# Patient Record
Sex: Female | Born: 1970 | Race: White | Hispanic: No | Smoking: Never smoker
Health system: Southern US, Community
[De-identification: ages and names within clinical notes are randomized; demographics above are authoritative.]

## PROBLEM LIST (undated history)

## (undated) DIAGNOSIS — R569 Unspecified convulsions: Secondary | ICD-10-CM

## (undated) DIAGNOSIS — F4310 Post-traumatic stress disorder, unspecified: Secondary | ICD-10-CM

## (undated) DIAGNOSIS — F329 Major depressive disorder, single episode, unspecified: Secondary | ICD-10-CM

## (undated) DIAGNOSIS — F32A Depression, unspecified: Secondary | ICD-10-CM

## (undated) HISTORY — DX: Major depressive disorder, single episode, unspecified: F32.9

## (undated) HISTORY — DX: Depression, unspecified: F32.A

## (undated) HISTORY — PX: TUBAL LIGATION: SHX77

---

## 2017-07-18 ENCOUNTER — Other Ambulatory Visit: Payer: Self-pay

## 2017-07-18 ENCOUNTER — Emergency Department (HOSPITAL_COMMUNITY)
Admission: EM | Admit: 2017-07-18 | Discharge: 2017-07-18 | Disposition: A | Discharge status: Home / Self Care | Payer: Medicare Other | Attending: Emergency Medicine | Admitting: Emergency Medicine

## 2017-07-18 ENCOUNTER — Encounter (HOSPITAL_COMMUNITY): Payer: Self-pay

## 2017-07-18 DIAGNOSIS — R569 Unspecified convulsions: Secondary | ICD-10-CM | POA: Diagnosis present

## 2017-07-18 DIAGNOSIS — R079 Chest pain, unspecified: Secondary | ICD-10-CM | POA: Insufficient documentation

## 2017-07-18 DIAGNOSIS — R5383 Other fatigue: Secondary | ICD-10-CM | POA: Insufficient documentation

## 2017-07-18 DIAGNOSIS — R531 Weakness: Secondary | ICD-10-CM

## 2017-07-18 DIAGNOSIS — R51 Headache: Secondary | ICD-10-CM | POA: Insufficient documentation

## 2017-07-18 HISTORY — DX: Post-traumatic stress disorder, unspecified: F43.10

## 2017-07-18 HISTORY — DX: Unspecified convulsions: R56.9

## 2017-07-18 LAB — BASIC METABOLIC PANEL
Anion gap: 9 (ref 5–15)
BUN: 7 mg/dL (ref 6–20)
CHLORIDE: 105 mmol/L (ref 98–111)
CO2: 27 mmol/L (ref 22–32)
Calcium: 8.9 mg/dL (ref 8.9–10.3)
Creatinine, Ser: 0.76 mg/dL (ref 0.44–1.00)
GFR calc Af Amer: >60 mL/min (ref >60)
GFR calc non Af Amer: >60 mL/min (ref >60)
GLUCOSE: 97 mg/dL (ref 70–99)
Potassium: 4 mmol/L (ref 3.5–5.1)
SODIUM: 141 mmol/L (ref 135–145)

## 2017-07-18 LAB — CBC
HEMATOCRIT: 37.7 % (ref 36.0–46.0)
Hemoglobin: 12.6 g/dL (ref 12.0–15.0)
MCH: 29.2 pg (ref 26.0–34.0)
MCHC: 33.4 g/dL (ref 30.0–36.0)
MCV: 87.5 fL (ref 78.0–100.0)
Platelets: 203 10*3/uL (ref 150–400)
RBC: 4.31 MIL/uL (ref 3.87–5.11)
RDW: 13.5 % (ref 11.5–15.5)
WBC: 8.3 10*3/uL (ref 4.0–10.5)

## 2017-07-18 LAB — PHENYTOIN LEVEL, TOTAL: Phenytoin Lvl: <2.5 ug/mL — ABNORMAL LOW (ref 10.0–20.0)

## 2017-07-18 LAB — URINALYSIS, ROUTINE W REFLEX MICROSCOPIC
BILIRUBIN URINE: NEGATIVE
Glucose, UA: NEGATIVE mg/dL
KETONES UR: NEGATIVE mg/dL
Nitrite: NEGATIVE
PROTEIN: NEGATIVE mg/dL
Specific Gravity, Urine: 1.02 (ref 1.005–1.030)
pH: 5 (ref 5.0–8.0)

## 2017-07-18 LAB — I-STAT BETA HCG BLOOD, ED (MC, WL, AP ONLY): I-stat hCG, quantitative: <5 m[IU]/mL (ref <5)

## 2017-07-18 LAB — CBG MONITORING, ED: GLUCOSE-CAPILLARY: 99 mg/dL (ref 70–99)

## 2017-07-18 MED ORDER — PHENYTOIN SODIUM EXTENDED 100 MG PO CAPS: 100.0000 mg | ORAL_CAPSULE | Freq: Two times a day (BID) | ORAL | 0 refills | Status: DC

## 2017-07-18 MED ORDER — PHENYTOIN SODIUM 50 MG/ML IJ SOLN
1000.0000 mg | Freq: Once | INTRAMUSCULAR | Status: AC
  Administered 2017-07-18: 1000 mg via INTRAVENOUS
  Filled 2017-07-18: qty 20

## 2017-07-18 NOTE — Discharge Instructions (Signed)
As discussed, your evaluation today has been largely reassuring.  But, it is important that you monitor your condition carefully, and do not hesitate to return to the ED if you develop new, or concerning changes in your condition. ? ?Otherwise, please follow-up with your physician for appropriate ongoing care. ? ?

## 2017-07-18 NOTE — ED Provider Notes (Signed)
[facility]-EMERGENCY DEPT Provider Note   CSN: [id number] Arrival date & time: 07/18/17  1356     History   Chief Complaint Chief Complaint  Patient presents with  . Fatigue  . Seizures    HPI Teresa Cervantes is a 47 y.o. female.  HPI Patient presents with her husband who assists with the HPI. Patient has multiple medical issues including seizures.  When she typically takes Dilantin but ran out of his medication about 1 week ago. They note that over the past week patient has had increasing seizure activity, though none in the past few hours. She also has generalized weakness, with occasional episodes of pronounced weakness in her legs, though not sustained, and without pain. She also has episodes of chest pain, headache. Patient recently relocated from [place] to this area, has no physician. They deny other notable changes.   Past Medical History:  Diagnosis Date  . PTSD (post-traumatic stress disorder)   . Seizures (HCC)     There are no active problems to display for this patient.   Past Surgical History:  Procedure Laterality Date  . CESAREAN SECTION     X2     OB History   None      Home Medications    Prior to Admission medications   Not on File    Family History History reviewed. No pertinent family history.  Social History Social History   Tobacco Use  . Smoking status: Never Smoker  . Smokeless tobacco: Never Used  Substance Use Topics  . Alcohol use: Not Currently  . Drug use: Never     Allergies   Penicillins   Review of Systems Review of Systems  Constitutional:       Per HPI, otherwise negative  HENT:       Per HPI, otherwise negative  Respiratory:       Per HPI, otherwise negative  Cardiovascular:       Per HPI, otherwise negative  Gastrointestinal: Negative for vomiting.  Endocrine:       Negative aside from HPI  Genitourinary:       Neg aside from HPI   Musculoskeletal:       Per HPI,  otherwise negative  Skin: Negative.   Neurological: Positive for seizures, weakness and light-headedness. Negative for syncope.  Psychiatric/Behavioral: Positive for decreased concentration.     Physical Exam Updated Vital Signs BP 114/71   Pulse (!) 54   Temp 98.2 F (36.8 C) (Oral)   Resp 12   Ht 5\' 4"  (1.626 m)   Wt 118.8 kg (262 lb)   LMP 07/13/2017 (Approximate)   SpO2 100%   BMI 44.97 kg/m   Physical Exam  Constitutional: She is oriented to person, place, and time. She appears well-developed and well-nourished. No distress.  HENT:  Head: Normocephalic and atraumatic.  Eyes: Conjunctivae and EOM are normal.  Cardiovascular: Normal rate and regular rhythm.  Pulmonary/Chest: Effort normal and breath sounds normal. No stridor. No respiratory distress.  Abdominal: She exhibits no distension.  Musculoskeletal: She exhibits no edema.  Neurological: She is alert and oriented to person, place, and time. She displays no atrophy and no tremor. No cranial nerve deficit. She exhibits normal muscle tone. She displays no seizure activity. Coordination normal.  Skin: Skin is warm and dry.  Psychiatric: She has a normal mood and affect.  Nursing note and vitals reviewed.    ED Treatments / Results  Labs (all labs ordered are listed, but only abnormal  results are displayed) Labs Reviewed  URINALYSIS, ROUTINE W REFLEX MICROSCOPIC - Abnormal; Notable for the following components:      Result Value   Color, Urine AMBER (*)    APPearance CLOUDY (*)    Hgb urine dipstick MODERATE (*)    Leukocytes, UA SMALL (*)    Bacteria, UA FEW (*)    Squamous Epithelial / LPF >50 (*)    All other components within normal limits  PHENYTOIN LEVEL, TOTAL - Abnormal; Notable for the following components:   Phenytoin Lvl <2.5 (*)    All other components within normal limits  BASIC METABOLIC PANEL  CBC  CBG MONITORING, ED  I-STAT BETA HCG BLOOD, ED ([facility], [facility], [facility] ONLY)     EKG None  Radiology No results found.  Procedures Procedures (including critical care time)  Medications Ordered in ED Medications  phenytoin (DILANTIN) 1,000 mg in sodium chloride 0.9 % 250 mL IVPB (1,000 mg Intravenous New Bag/Given 07/18/17 1921)     Initial Impression / Assessment and Plan / ED Course  I have reviewed the triage vital signs and the nursing notes.  Pertinent labs & imaging results that were available during my care of the patient were reviewed by me and considered in my medical decision making (see chart for details).     7:37 PM Patient awake and alert, interacting in a typical manner, hemodynamically unremarkable. We discussed all findings, including subtherapeutic phenytoin level. Patient has no seizure activity while in the emergency department, and given her reassuring findings, patient will start Dilantin load.  Update:, Patient tolerated Dilantin appropriately, without additional seizure activity, and with reassuring findings, patient discharged in stable condition.  Final Clinical Impressions(s) / ED Diagnoses  Weakness   Gerhard MunchLockwood, Keela Rubert, MD 07/18/17 [phone]

## 2017-07-18 NOTE — ED Notes (Signed)
pts vein blew during blood draw unable to successfully get remaining labs.

## 2017-07-18 NOTE — ED Triage Notes (Addendum)
PT C/O FEELING FATIGUE, CHEST PAINS ON AND OFF, LEFT-SIDED HEADACHE RADIATING DOWN THE LEFT ARM, WITNESSED SEIZURES X2, AND INCREASED THIRST X1 WEEK. PT STS SHE TAKES DILANTIN FOR SEIZURES, AND THE LAST TIME SHE FELT THIS WAY, HER LEVELS WERE VERY LOW. DENIES HEADACHE OR CHEST PAIN AT THIS TIME. PT STS SHE JUST MOVED FROM [place] NO HEALTHCARE SET UP HERE.

## 2017-08-09 ENCOUNTER — Encounter: Payer: Self-pay | Admitting: General Practice

## 2017-08-09 ENCOUNTER — Ambulatory Visit: Payer: Self-pay

## 2017-09-03 ENCOUNTER — Encounter (HOSPITAL_COMMUNITY): Payer: Self-pay | Admitting: *Deleted

## 2017-09-03 ENCOUNTER — Emergency Department (HOSPITAL_COMMUNITY)
Admission: EM | Admit: 2017-09-03 | Discharge: 2017-09-03 | Disposition: A | Discharge status: Home / Self Care | Payer: Medicare Other | Attending: Emergency Medicine | Admitting: Emergency Medicine

## 2017-09-03 DIAGNOSIS — Z79899 Other long term (current) drug therapy: Secondary | ICD-10-CM | POA: Insufficient documentation

## 2017-09-03 DIAGNOSIS — R569 Unspecified convulsions: Secondary | ICD-10-CM

## 2017-09-03 LAB — BASIC METABOLIC PANEL
Anion gap: 10 (ref 5–15)
BUN: 12 mg/dL (ref 6–20)
CHLORIDE: 107 mmol/L (ref 98–111)
CO2: 21 mmol/L — ABNORMAL LOW (ref 22–32)
CREATININE: 0.83 mg/dL (ref 0.44–1.00)
Calcium: 8.1 mg/dL — ABNORMAL LOW (ref 8.9–10.3)
GFR calc non Af Amer: >60 mL/min (ref >60)
Glucose, Bld: 115 mg/dL — ABNORMAL HIGH (ref 70–99)
Potassium: 4 mmol/L (ref 3.5–5.1)
SODIUM: 138 mmol/L (ref 135–145)

## 2017-09-03 LAB — CBC WITH DIFFERENTIAL/PLATELET
Basophils Absolute: 0 10*3/uL (ref 0.0–0.1)
Basophils Relative: 0 %
Eosinophils Absolute: 0.1 10*3/uL (ref 0.0–0.7)
Eosinophils Relative: 1 %
HEMATOCRIT: 34.8 % — AB (ref 36.0–46.0)
HEMOGLOBIN: 11.9 g/dL — AB (ref 12.0–15.0)
Lymphocytes Relative: 37 %
Lymphs Abs: 2.7 10*3/uL (ref 0.7–4.0)
MCH: 29.8 pg (ref 26.0–34.0)
MCHC: 34.2 g/dL (ref 30.0–36.0)
MCV: 87.2 fL (ref 78.0–100.0)
Monocytes Absolute: 0.6 10*3/uL (ref 0.1–1.0)
Monocytes Relative: 9 %
NEUTROS ABS: 3.8 10*3/uL (ref 1.7–7.7)
Neutrophils Relative %: 53 %
Platelets: 199 10*3/uL (ref 150–400)
RBC: 3.99 MIL/uL (ref 3.87–5.11)
RDW: 13.6 % (ref 11.5–15.5)
WBC: 7.3 10*3/uL (ref 4.0–10.5)

## 2017-09-03 LAB — PHENYTOIN LEVEL, TOTAL: Phenytoin Lvl: <2.5 ug/mL — ABNORMAL LOW (ref 10.0–20.0)

## 2017-09-03 MED ORDER — PHENYTOIN SODIUM EXTENDED 100 MG PO CAPS: 100.0000 mg | ORAL_CAPSULE | Freq: Two times a day (BID) | ORAL | 2 refills | Status: DC

## 2017-09-03 MED ORDER — PHENYTOIN SODIUM EXTENDED 100 MG PO CAPS
300.0000 mg | ORAL_CAPSULE | Freq: Once | ORAL | Status: AC
  Administered 2017-09-03: 300 mg via ORAL
  Filled 2017-09-03: qty 3

## 2017-09-03 NOTE — ED Provider Notes (Signed)
[facility]-EMERGENCY DEPT Provider Note   CSN: [id number] Arrival date & time: 09/03/17  1658     History   Chief Complaint Chief Complaint  Patient presents with  . Seizures  . Headache    HPI Teresa Cervantes is a 47 y.o. female.  The history is provided by the patient. No language interpreter was used.  Seizures   This is a recurrent problem. The current episode started more than 1 week ago. The problem has not changed since onset.There were 4 to 5 seizures. Associated symptoms include headaches. Characteristics include rhythmic jerking. The episode was witnessed. The seizures did not continue in the ED. The seizure(s) had no focality. There has been no fever.  Headache    Pt complains of being out of her dilantin for a week. She reports she has been having seizures.  Pt is currently feeling fine.  Pt is here to get back on her dilantin  Past Medical History:  Diagnosis Date  . PTSD (post-traumatic stress disorder)   . Seizures (HCC)     There are no active problems to display for this patient.   Past Surgical History:  Procedure Laterality Date  . CESAREAN SECTION     X2     OB History   None      Home Medications    Prior to Admission medications   Medication Sig Start Date End Date Taking? Authorizing Provider  FLUoxetine (PROZAC) 20 MG capsule Take 20 mg by mouth daily.   Yes [provider]  phenytoin (DILANTIN) 100 MG ER capsule Take 1 capsule (100 mg total) by mouth 2 (two) times daily. 07/18/17   Gerhard Munch, MD    Family History No family history on file.  Social History Social History   Tobacco Use  . Smoking status: Never Smoker  . Smokeless tobacco: Never Used  Substance Use Topics  . Alcohol use: Not Currently  . Drug use: Never     Allergies   Penicillins   Review of Systems Review of Systems  Neurological: Positive for seizures and headaches.  All other systems reviewed and are  negative.    Physical Exam Updated Vital Signs BP 137/85 (BP Location: Left Arm)   Pulse 66   Temp 98.1 F (36.7 C) (Oral)   Resp 18   LMP 08/31/2017   SpO2 94%   Physical Exam  Constitutional: She is oriented to person, place, and time. She appears well-developed and well-nourished.  HENT:  Head: Normocephalic.  Eyes: Pupils are equal, round, and reactive to light. EOM are normal.  Neck: Normal range of motion.  Pulmonary/Chest: Effort normal.  Abdominal: She exhibits no distension.  Musculoskeletal: Normal range of motion.  Neurological: She is alert and oriented to person, place, and time. She has normal strength.  Skin: Skin is warm.  Psychiatric: She has a normal mood and affect.  Nursing note and vitals reviewed.    ED Treatments / Results  Labs (all labs ordered are listed, but only abnormal results are displayed) Labs Reviewed  CBC WITH DIFFERENTIAL/PLATELET - Abnormal; Notable for the following components:      Result Value   Hemoglobin 11.9 (*)    HCT 34.8 (*)    All other components within normal limits  BASIC METABOLIC PANEL - Abnormal; Notable for the following components:   CO2 21 (*)    Glucose, Bld 115 (*)    Calcium 8.1 (*)    All other components within normal limits  PHENYTOIN  LEVEL, TOTAL - Abnormal; Notable for the following components:   Phenytoin Lvl <2.5 (*)    All other components within normal limits    EKG None  Radiology No results found.  Procedures Procedures (including critical care time)  Medications Ordered in ED Medications - No data to display   Initial Impression / Assessment and Plan / ED Course  I have reviewed the triage vital signs and the nursing notes.  Pertinent labs & imaging results that were available during my care of the patient were reviewed by me and considered in my medical decision making (see chart for details).     MD  Dilantin level less than 2.5.   I will restart pt on dilantin.   Pt given  300mg  here.  Rx for 100mg  bid as previously prescribed.   Final Clinical Impressions(s) / ED Diagnoses   Final diagnoses:  Seizure [facility](HCC)    ED Discharge Orders         Ordered    phenytoin (DILANTIN) 100 MG ER capsule  2 times daily     09/03/17 1900        An After Visit Summary was printed and given to the patient.    Elson AreasSofia, Sydny Schnitzler K, PA-C 09/03/17 1918    Virgina Norfolkuratolo, Adam, DO 09/04/17 0004

## 2017-09-03 NOTE — ED Triage Notes (Signed)
Pt states she has been having more seizures lately. Pt also complains of headache today. Pt has been out of dilantin x 1 week. Pt also complains of burning while urinating. Pt has been unable to see a neurologist to get back on dilantin.

## 2017-09-03 NOTE — Discharge Instructions (Signed)
See your Primary care provider for evaluation and management of seizure medications

## 2017-09-06 ENCOUNTER — Emergency Department (HOSPITAL_COMMUNITY)
Admission: EM | Admit: 2017-09-06 | Discharge: 2017-09-07 | Disposition: A | Discharge status: Home / Self Care | Payer: Medicare Other | Attending: Emergency Medicine | Admitting: Emergency Medicine

## 2017-09-06 ENCOUNTER — Other Ambulatory Visit: Payer: Self-pay

## 2017-09-06 DIAGNOSIS — R0789 Other chest pain: Secondary | ICD-10-CM | POA: Diagnosis present

## 2017-09-06 DIAGNOSIS — Z79899 Other long term (current) drug therapy: Secondary | ICD-10-CM | POA: Insufficient documentation

## 2017-09-06 NOTE — ED Triage Notes (Signed)
Pt was seen here Friday for seizures.  Went to bed tonight had sudden onset of chest pain below both breasts. Hands felt numb/tingling. 12 lead NSR. No hx. Prozac for PTSD.  Gave 324 ASA. Pt has tenderness to palpation on her chest wall.

## 2017-09-07 ENCOUNTER — Emergency Department (HOSPITAL_COMMUNITY): Payer: Medicare Other

## 2017-09-07 DIAGNOSIS — R0789 Other chest pain: Secondary | ICD-10-CM | POA: Diagnosis not present

## 2017-09-07 LAB — BASIC METABOLIC PANEL
Anion gap: 8 (ref 5–15)
BUN: 11 mg/dL (ref 6–20)
CHLORIDE: 102 mmol/L (ref 98–111)
CO2: 27 mmol/L (ref 22–32)
CREATININE: 1.02 mg/dL — AB (ref 0.44–1.00)
Calcium: 8.4 mg/dL — ABNORMAL LOW (ref 8.9–10.3)
GFR calc Af Amer: >60 mL/min (ref >60)
GFR calc non Af Amer: >60 mL/min (ref >60)
GLUCOSE: 106 mg/dL — AB (ref 70–99)
POTASSIUM: 4.2 mmol/L (ref 3.5–5.1)
SODIUM: 137 mmol/L (ref 135–145)

## 2017-09-07 LAB — CBC
HCT: 36.7 % (ref 36.0–46.0)
Hemoglobin: 11.9 g/dL — ABNORMAL LOW (ref 12.0–15.0)
MCH: 29.3 pg (ref 26.0–34.0)
MCHC: 32.4 g/dL (ref 30.0–36.0)
MCV: 90.4 fL (ref 78.0–100.0)
Platelets: 169 10*3/uL (ref 150–400)
RBC: 4.06 MIL/uL (ref 3.87–5.11)
RDW: 13.4 % (ref 11.5–15.5)
WBC: 9.2 10*3/uL (ref 4.0–10.5)

## 2017-09-07 LAB — I-STAT BETA HCG BLOOD, ED (MC, WL, AP ONLY)

## 2017-09-07 LAB — HEPATIC FUNCTION PANEL
ALT: 24 U/L (ref 0–44)
AST: 65 U/L — ABNORMAL HIGH (ref 15–41)
Albumin: 3.4 g/dL — ABNORMAL LOW (ref 3.5–5.0)
Alkaline Phosphatase: 176 U/L — ABNORMAL HIGH (ref 38–126)
Bilirubin, Direct: <0.1 mg/dL (ref 0.0–0.2)
TOTAL PROTEIN: 6.7 g/dL (ref 6.5–8.1)
Total Bilirubin: 0.3 mg/dL (ref 0.3–1.2)

## 2017-09-07 LAB — D-DIMER, QUANTITATIVE (NOT AT ARMC): D DIMER QUANT: 0.7 ug{FEU}/mL — AB (ref 0.00–0.50)

## 2017-09-07 LAB — LIPASE, BLOOD: LIPASE: 37 U/L (ref 11–51)

## 2017-09-07 LAB — I-STAT TROPONIN, ED: Troponin i, poc: 0.02 ng/mL (ref 0.00–0.08)

## 2017-09-07 MED ORDER — NAPROXEN 500 MG PO TABS: 500.0000 mg | ORAL_TABLET | Freq: Two times a day (BID) | ORAL | 0 refills | Status: DC

## 2017-09-07 MED ORDER — KETOROLAC TROMETHAMINE 30 MG/ML IJ SOLN
30.0000 mg | Freq: Once | INTRAMUSCULAR | Status: AC
  Administered 2017-09-07: 30 mg via INTRAVENOUS
  Filled 2017-09-07: qty 1

## 2017-09-07 MED ORDER — IOPAMIDOL (ISOVUE-370) INJECTION 76%
100.0000 mL | Freq: Once | INTRAVENOUS | Status: AC | PRN
  Administered 2017-09-07: 50 mL via INTRAVENOUS

## 2017-09-07 NOTE — ED Notes (Signed)
Patient transported to X-ray 

## 2017-09-07 NOTE — Discharge Instructions (Addendum)
You were seen today for chest pain.  Your work-up is reassuring including heart testing and screening test for blood clots.  Follow-up closely with your primary doctor.  Take naproxen as needed for any pain.

## 2017-09-07 NOTE — ED Provider Notes (Signed)
[facility] EMERGENCY DEPARTMENT Provider Note   CSN: [id number] Arrival date & time: 09/06/17  2354     History   Chief Complaint Chief Complaint  Patient presents with  . Chest Pain    HPI Teresa Cervantes is a 47 y.o. female.  HPI  This is a 47 year old female with a history of PTSD and seizures who presents with chest pain.  Patient reports onset of chest pain tonight around 10 PM.  She states that it is squeezing in nature and across her entire lower chest just below her breast.  She states that she had just taken her Dilantin.  She denies any worsening of pain with breathing or exertion.  She is currently without pain.  She states the pain comes and goes.  She has not taken anything for the pain.  She does report several episodes of nonbilious, nonbloody emesis prior to onset of pain.  Denies abdominal pain.  Denies fever, cough.  Denies early family history of heart disease, hypertension, hyperlipidemia, diabetes.  Past Medical History:  Diagnosis Date  . PTSD (post-traumatic stress disorder)   . Seizures (HCC)     There are no active problems to display for this patient.   Past Surgical History:  Procedure Laterality Date  . CESAREAN SECTION     X2     OB History   None      Home Medications    Prior to Admission medications   Medication Sig Start Date End Date Taking? Authorizing Provider  FLUoxetine (PROZAC) 20 MG capsule Take 20 mg by mouth daily.    [provider]  naproxen (NAPROSYN) 500 MG tablet Take 1 tablet (500 mg total) by mouth 2 (two) times daily. 09/07/17   Horton, Mayer Masker, MD  phenytoin (DILANTIN) 100 MG ER capsule Take 1 capsule (100 mg total) by mouth 2 (two) times daily. 09/03/17   Elson Areas, PA-C    Family History No family history on file.  Social History Social History   Tobacco Use  . Smoking status: Never Smoker  . Smokeless tobacco: Never Used  Substance Use Topics  . Alcohol use: Not  Currently  . Drug use: Never     Allergies   Penicillins   Review of Systems Review of Systems  Constitutional: Negative for fever.  Respiratory: Negative for cough and shortness of breath.   Cardiovascular: Positive for chest pain. Negative for leg swelling.  Gastrointestinal: Positive for nausea and vomiting. Negative for abdominal pain.  Genitourinary: Negative for dysuria.  Neurological: Negative for weakness and numbness.  All other systems reviewed and are negative.    Physical Exam Updated Vital Signs BP 138/86 (BP Location: Right Arm)   Pulse 66   Temp 98.5 F (36.9 C) (Oral)   Resp 16   LMP 08/31/2017   SpO2 100%   Physical Exam  Constitutional: She is oriented to person, place, and time. She appears well-developed and well-nourished.  Obese, nontoxic-appearing  HENT:  Head: Normocephalic and atraumatic.  Eyes: Pupils are equal, round, and reactive to light.  Cardiovascular: Normal rate, regular rhythm, normal heart sounds and normal pulses.  Tenderness palpation midsternal area, no overlying skin changes or crepitus noted  Pulmonary/Chest: Effort normal. No respiratory distress. She has no wheezes.  Abdominal: Soft. Bowel sounds are normal.  Musculoskeletal:       Right lower leg: She exhibits no edema.       Left lower leg: She exhibits no edema.  No calf tenderness  noted  Neurological: She is alert and oriented to person, place, and time.  Skin: Skin is warm and dry.  Psychiatric: She has a normal mood and affect.  Nursing note and vitals reviewed.    ED Treatments / Results  Labs (all labs ordered are listed, but only abnormal results are displayed) Labs Reviewed  BASIC METABOLIC PANEL - Abnormal; Notable for the following components:      Result Value   Glucose, Bld 106 (*)    Creatinine, Ser 1.02 (*)    Calcium 8.4 (*)    All other components within normal limits  CBC - Abnormal; Notable for the following components:   Hemoglobin 11.9 (*)     All other components within normal limits  D-DIMER, QUANTITATIVE (NOT AT [facility]) - Abnormal; Notable for the following components:   D-Dimer, Quant 0.70 (*)    All other components within normal limits  HEPATIC FUNCTION PANEL - Abnormal; Notable for the following components:   Albumin 3.4 (*)    AST 65 (*)    Alkaline Phosphatase 176 (*)    All other components within normal limits  LIPASE, BLOOD  I-STAT TROPONIN, ED  I-STAT BETA HCG BLOOD, ED ([facility], [facility], [facility] ONLY)    EKG EKG Interpretation  Date/Time:  Tuesday September 07 2017 00:01:38 EDT Ventricular Rate:  71 PR Interval:    QRS Duration: 86 QT Interval:  420 QTC Calculation: 457 R Axis:   37 Text Interpretation:  Sinus rhythm Confirmed by Ross Marcus ([id number]) on 09/07/2017 12:09:00 AM   Radiology Dg Chest 2 View  Result Date: 09/07/2017 CLINICAL DATA:  Pain beneath the breast and chest tightness. EXAM: CHEST - 2 VIEW COMPARISON:  None. FINDINGS: Slightly low lung volumes with crowding of interstitial lung markings. No alveolar consolidation, effusion or pulmonary edema. No pneumothorax. Borderline cardiomegaly with mild aortic atherosclerosis. No acute osseous abnormality. IMPRESSION: Low lung volumes without active pulmonary disease. Electronically Signed   By: Tollie Eth M.D.   On: 09/07/2017 00:17   Ct Angio Chest Pe W And/or Wo Contrast  Result Date: 09/07/2017 CLINICAL DATA:  New onset chest pain with hand numbness and tingling. Recent seizures. EXAM: CT ANGIOGRAPHY CHEST WITH CONTRAST TECHNIQUE: Multidetector CT imaging of the chest was performed using the standard protocol during bolus administration of intravenous contrast. Multiplanar CT image reconstructions and MIPs were obtained to evaluate the vascular anatomy. CONTRAST:  50mL ISOVUE-370 IOPAMIDOL (ISOVUE-370) INJECTION 76% COMPARISON:  Chest radiograph December 07, 2017 FINDINGS: CARDIOVASCULAR: Adequate contrast opacification of the pulmonary artery's. Main  pulmonary artery is not enlarged. No pulmonary arterial filling defects to the level of the subsegmental branches. Heart size is mildly enlarged. Minimal coronary artery calcifications. No pericardial effusion. No pericardial effusion. Thoracic aorta is normal course and caliber, unremarkable. MEDIASTINUM/NODES: No lymphadenopathy by CT size criteria. LUNGS/PLEURA: Tracheobronchial tree is patent, no pneumothorax. Minimal bronchial wall thickening. Mild heterogeneous lung attenuation. No pleural effusions, focal consolidations, pulmonary nodules or masses. UPPER ABDOMEN: Non-acute. MUSCULOSKELETAL: Non-acute. LEFT thyromegaly without dominant nodule, incompletely imaged. Mild degenerative changes thoracic spine. Review of the MIP images confirms the above findings. IMPRESSION: 1. No acute pulmonary embolism. 2. Mild cardiomegaly. 3. Minimal bronchial wall thickening. Mild heterogeneous lung attenuation seen with small airway disease. Electronically Signed   By: Awilda Metro M.D.   On: 09/07/2017 02:39    Procedures Procedures (including critical care time)  Medications Ordered in ED Medications  ketorolac (TORADOL) 30 MG/ML injection 30 mg (30 mg Intravenous Given 09/07/17 0103)  iopamidol (ISOVUE-370) 76 % injection 100 mL (50 mLs Intravenous Contrast Given 09/07/17 0229)     Initial Impression / Assessment and Plan / ED Course  I have reviewed the triage vital signs and the nursing notes.  Pertinent labs & imaging results that were available during my care of the patient were reviewed by me and considered in my medical decision making (see chart for details).     Patient presents with chest pain.  She is overall nontoxic-appearing on exam.  Vital signs are reassuring.  She has reproducible chest pain on exam.  She is currently pain-free.  Chest x-ray shows no evidence of pneumothorax or pneumonia.  EKG without evidence of ischemia or arrhythmia.  Troponin is negative.  Screening d-dimer was  positive.  CT scan obtained and shows no evidence of PE.  On recheck, patient reports that she has remained symptom-free and improved after 1 dose of Toradol.  Suspect chest wall pain.  Will trial naproxen.  Will have patient follow-up closely with her primary physician.  After history, exam, and medical workup I feel the patient has been appropriately medically screened and is safe for discharge home. Pertinent diagnoses were discussed with the patient. Patient was given return precautions.    Final Clinical Impressions(s) / ED Diagnoses   Final diagnoses:  Atypical chest pain    ED Discharge Orders         Ordered    naproxen (NAPROSYN) 500 MG tablet  2 times daily     09/07/17 0312           Shon Baton, MD 09/07/17 [phone]

## 2017-11-02 ENCOUNTER — Ambulatory Visit: Payer: Self-pay | Admitting: Primary Care

## 2017-11-15 ENCOUNTER — Encounter: Payer: Self-pay | Admitting: Primary Care

## 2017-11-15 ENCOUNTER — Ambulatory Visit (INDEPENDENT_AMBULATORY_CARE_PROVIDER_SITE_OTHER): Payer: Medicare Other | Admitting: Primary Care

## 2017-11-15 VITALS — BP 118/78 | HR 78 | Temp 98.0°F | Ht 62.0 in | Wt 259.0 lb

## 2017-11-15 DIAGNOSIS — G40909 Epilepsy, unspecified, not intractable, without status epilepticus: Secondary | ICD-10-CM | POA: Insufficient documentation

## 2017-11-15 DIAGNOSIS — R21 Rash and other nonspecific skin eruption: Secondary | ICD-10-CM | POA: Diagnosis not present

## 2017-11-15 DIAGNOSIS — F3342 Major depressive disorder, recurrent, in full remission: Secondary | ICD-10-CM | POA: Insufficient documentation

## 2017-11-15 DIAGNOSIS — Z23 Encounter for immunization: Secondary | ICD-10-CM

## 2017-11-15 LAB — COMPREHENSIVE METABOLIC PANEL
ALBUMIN: 4 g/dL (ref 3.5–5.2)
ALK PHOS: 180 U/L — AB (ref 39–117)
ALT: 11 U/L (ref 0–35)
AST: 20 U/L (ref 0–37)
BILIRUBIN TOTAL: 0.4 mg/dL (ref 0.2–1.2)
BUN: 10 mg/dL (ref 6–23)
CHLORIDE: 102 meq/L (ref 96–112)
CO2: 30 mEq/L (ref 19–32)
CREATININE: 0.83 mg/dL (ref 0.40–1.20)
Calcium: 9.1 mg/dL (ref 8.4–10.5)
GFR: 78.14 mL/min (ref >60.00)
Glucose, Bld: 101 mg/dL — ABNORMAL HIGH (ref 70–99)
Potassium: 4.6 mEq/L (ref 3.5–5.1)
SODIUM: 138 meq/L (ref 135–145)
TOTAL PROTEIN: 7.8 g/dL (ref 6.0–8.3)

## 2017-11-15 MED ORDER — FLUOXETINE HCL 20 MG PO CAPS: 20.0000 mg | ORAL_CAPSULE | Freq: Every day | ORAL | 3 refills | Status: DC

## 2017-11-15 NOTE — Assessment & Plan Note (Signed)
Doesn't appear to be bedbugs. Will have her trail hydrocortisone BID x 7 days. She will update. If no improvement then very reasonable to trial Permethrin. She will update.

## 2017-11-15 NOTE — Patient Instructions (Addendum)
Stop by the lab prior to leaving today. I will notify you of your results once received.   You will be contacted regarding your referral to Neurology.  Please let us know if you have not been contacted within one week.   Continue taking phenytoin 100 mg twice daily as prescribed. Please message me if you need refills before meeting with the neurologist.   Continue taking fluoxetine (Prozac) daily for anxiety and depression. I sent refills to your pharmacy.  Please schedule a physical with me within the next 6 months at your convenience. You may also schedule a lab only appointment 3-4 days prior. We will discuss your lab results in detail during your physical.  It was a pleasure to meet you today! Please don't hesitate to call or message me with any questions. Welcome to [facility]!

## 2017-11-15 NOTE — Assessment & Plan Note (Signed)
Doing well on fluoxetine, continue same. Denies SI/HI. Refills sent to pharmacy.

## 2017-11-15 NOTE — Progress Notes (Signed)
Subjective:    Patient ID: Teresa Cervantes, female    DOB: 1970-08-15, 47 y.o.   MRN: [id number]  HPI  Ms. Teresa Cervantes is a 47 year old female who presents today to establish care and discuss the problems mentioned below. Will obtain old records.  1) Anxiety and Depression: Currently managed on fluoxetine 20 mg for which she's been taking for the last 6-7 months. Overall she feels well managed. She denies SI/HI. She is needing a refill as she has 2 capsules remaining.   2) Seizure Disorder: Managed on phenytoin 100 mg ER twice daily. Diagnosed in 1995. Last seizure 2 weeks ago. She is compliant to her phenytoin daily. She did run out of phenytoin for several days in late August 2019, went to the ED and was provided with refills. Dilantin level was low at that time. She is not seeing a neurologist. Moved to [place] from [place] in June 2019.  BP Readings from Last 3 Encounters:  11/15/17 118/78  09/07/17 140/83  09/03/17 121/81   3) Sores/Bites: Located to anterior and posterior trunk for which she noticed one month ago. She is living with 8 other people in a trailer. The trailer has had bed bugs intermittently for the last several months, this has since been cleared. The sores are itchy, change color to red/purple. She's tried A&D ointment without much improvement. No one else has these sores in the home.    Review of Systems  Respiratory: Negative for shortness of breath.   Cardiovascular: Negative for chest pain.  Skin:       Sores/bites to skin  Neurological:       Last seizure 2 weeks ago.   Psychiatric/Behavioral:       See HPI       Past Medical History:  Diagnosis Date  . Depression   . PTSD (post-traumatic stress disorder)   . Seizures ([facility])      Social History   Socioeconomic History  . Marital status: Significant Other    Spouse name: Not on file  . Number of children: Not on file  . Years of education: Not on file  . Highest education level: Not on file    Occupational History  . Not on file  Social Needs  . Financial resource strain: Not on file  . Food insecurity:    Worry: Not on file    Inability: Not on file  . Transportation needs:    Medical: Not on file    Non-medical: Not on file  Tobacco Use  . Smoking status: Never Smoker  . Smokeless tobacco: Never Used  Substance and Sexual Activity  . Alcohol use: Not Currently  . Drug use: Never  . Sexual activity: Not on file  Lifestyle  . Physical activity:    Days per week: Not on file    Minutes per session: Not on file  . Stress: Not on file  Relationships  . Social connections:    Talks on phone: Not on file    Gets together: Not on file    Attends religious service: Not on file    Active member of club or organization: Not on file    Attends meetings of clubs or organizations: Not on file    Relationship status: Not on file  . Intimate partner violence:    Fear of current or ex partner: Not on file    Emotionally abused: Not on file    Physically abused: Not on file    Forced  sexual activity: Not on file  Other Topics Concern  . Not on file  Social History Narrative   Single.   2 children.   Not working.   Enjoys spending time with friends.    Past Surgical History:  Procedure Laterality Date  . CESAREAN SECTION     X2    History reviewed. No pertinent family history.  Allergies  Allergen Reactions  . Penicillins Hives    Has patient had a PCN reaction causing immediate rash, facial/tongue/throat swelling, SOB or lightheadedness with hypotension: Y Has patient had a PCN reaction causing severe rash involving mucus membranes or skin necrosis: Y Has patient had a PCN reaction that required hospitalization: Y Has patient had a PCN reaction occurring within the last 10 years: Y If all of the above answers are "NO", then may proceed with Cephalosporin use.     Current Outpatient Medications on File Prior to Visit  Medication Sig Dispense Refill  .  phenytoin (DILANTIN) 100 MG ER capsule Take 1 capsule (100 mg total) by mouth 2 (two) times daily. 60 capsule 2   No current facility-administered medications on file prior to visit.     BP 118/78   Pulse 78   Temp 98 F (36.7 C) (Oral)   Ht 5\' 2"  (1.575 m)   Wt 259 lb (117.5 kg)   LMP 11/02/2017   SpO2 98%   BMI 47.37 kg/m    Objective:   Physical Exam  Constitutional: She appears well-nourished.  Neck: Neck supple.  Cardiovascular: Normal rate and regular rhythm.  Respiratory: Effort normal and breath sounds normal.  Skin: Skin is warm and dry.  Several raised and flat purple/red spots to anterior and posterior trunk.   Psychiatric: She has a normal mood and affect.           Assessment & Plan:

## 2017-11-15 NOTE — Assessment & Plan Note (Addendum)
Compliant to phenytion ER 100 mg BID, continue same. Referral placed to neurology for maintenance. Dilantin levels pending.

## 2017-11-16 ENCOUNTER — Other Ambulatory Visit: Payer: Self-pay | Admitting: Primary Care

## 2017-11-16 DIAGNOSIS — G40909 Epilepsy, unspecified, not intractable, without status epilepticus: Secondary | ICD-10-CM

## 2017-11-16 DIAGNOSIS — R748 Abnormal levels of other serum enzymes: Secondary | ICD-10-CM

## 2017-11-16 LAB — PHENYTOIN LEVEL, TOTAL: PHENYTOIN, TOTAL: 2.7 mg/L — AB (ref 10.0–20.0)

## 2017-11-16 MED ORDER — PHENYTOIN SODIUM EXTENDED 100 MG PO CAPS: 100.0000 mg | ORAL_CAPSULE | Freq: Three times a day (TID) | ORAL | 0 refills | Status: DC

## 2017-11-23 ENCOUNTER — Ambulatory Visit
Admission: RE | Admit: 2017-11-23 | Discharge: 2017-11-23 | Disposition: A | Discharge status: Home / Self Care | Payer: Medicare Other | Source: Ambulatory Visit | Attending: Primary Care | Admitting: Primary Care

## 2017-11-23 DIAGNOSIS — K8011 Calculus of gallbladder with chronic cholecystitis with obstruction: Secondary | ICD-10-CM

## 2017-11-23 DIAGNOSIS — R748 Abnormal levels of other serum enzymes: Secondary | ICD-10-CM

## 2017-11-24 ENCOUNTER — Other Ambulatory Visit: Payer: Self-pay

## 2017-11-24 ENCOUNTER — Ambulatory Visit (INDEPENDENT_AMBULATORY_CARE_PROVIDER_SITE_OTHER): Payer: Medicare Other | Admitting: Surgery

## 2017-11-24 ENCOUNTER — Encounter: Payer: Self-pay | Admitting: *Deleted

## 2017-11-24 ENCOUNTER — Encounter: Payer: Self-pay | Admitting: Surgery

## 2017-11-24 DIAGNOSIS — R933 Abnormal findings on diagnostic imaging of other parts of digestive tract: Secondary | ICD-10-CM | POA: Diagnosis not present

## 2017-11-24 MED ORDER — ALPRAZOLAM 1 MG PO TABS: 1.0000 mg | ORAL_TABLET | ORAL | 0 refills | Status: AC | PRN

## 2017-11-24 NOTE — Progress Notes (Signed)
11/24/2017  Reason for Visit:  Abnormal labs and ultrasound  Referring Provider:  Alma Friendly, NP  History of Present Illness: Teresa Cervantes is a 47 y.o. female presenting for evaluation of abnormal labs and ultrasound.  She was seen by her PCP on 11/15/17 and had regular labwork done.  Her alkaline phosphatase was mildly elevated, which prompted an abdominal ultrasound.  This showed cholelithiasis with a gallbladder wall thickening to 5.4 mm, though it was underdistended.  There is also concern for a common bile duct of 6.4 mm which possible echogenic foci.  Given these findings, she was referred for further evaluation.   Overall, the patient is very surprised about the referral and abnormalities.  She denies having any symptoms.  Particularly, denies having any abdominal pain, nausea, vomiting, any indication of jaundice, icteric sclera, or dark urine.  Past Medical History: Past Medical History:  Diagnosis Date  . Depression   . PTSD (post-traumatic stress disorder)   . Seizures ([facility])      Past Surgical History: Past Surgical History:  Procedure Laterality Date  . CESAREAN SECTION     X2    Home Medications: Prior to Admission medications   Medication Sig Start Date End Date Taking? Authorizing Provider  FLUoxetine (PROZAC) 20 MG capsule Take 1 capsule (20 mg total) by mouth daily. For depression. 11/15/17  Yes Pleas Koch, NP  phenytoin (DILANTIN) 100 MG ER capsule Take 1 capsule (100 mg total) by mouth 3 (three) times daily. For seizure prevention. 11/16/17  Yes Pleas Koch, NP  ALPRAZolam Duanne Moron) 1 MG tablet Take 1 tablet (1 mg total) by mouth as needed for up to 1 day for anxiety (prn for MRI). 11/24/17 11/25/17  Olean Ree, MD    Allergies: Allergies  Allergen Reactions  . Penicillins Hives    Has patient had a PCN reaction causing immediate rash, facial/tongue/throat swelling, SOB or lightheadedness with hypotension: Y Has patient had a PCN  reaction causing severe rash involving mucus membranes or skin necrosis: Y Has patient had a PCN reaction that required hospitalization: Y Has patient had a PCN reaction occurring within the last 10 years: Y If all of the above answers are "NO", then may proceed with Cephalosporin use.     Social History:  reports that she has never smoked. She has never used smokeless tobacco. She reports that she drank alcohol. She reports that she does not use drugs.   Family History: Sister had cholecystectomy  Review of Systems: Review of Systems  Constitutional: Negative for chills and fever.  Eyes: Negative for blurred vision.  Respiratory: Negative for shortness of breath.   Cardiovascular: Negative for chest pain.  Gastrointestinal: Negative for abdominal pain, constipation, diarrhea, nausea and vomiting.  Genitourinary: Negative for dysuria.  Musculoskeletal: Negative for myalgias.  Skin: Negative for rash.  Neurological: Negative for dizziness.  Psychiatric/Behavioral: Negative for depression.    Physical Exam BP 125/85   Pulse (!) 156   Temp 97.7 F (36.5 C) (Temporal)   Resp (!) 22   Ht _0  (1.575 m)   Wt 260 lb 9.6 oz (118.2 kg)   LMP 11/02/2017   SpO2 99%   BMI 47.66 kg/m  CONSTITUTIONAL: No acute distress HEENT:  Normocephalic, atraumatic, extraocular motion intact. NECK: Trachea is midline, and there is no jugular venous distension.  RESPIRATORY:  Lungs are clear, and breath sounds are equal bilaterally. Normal respiratory effort without pathologic use of accessory muscles. CARDIOVASCULAR: Heart is regular without murmurs, gallops, or rubs.  GI: The abdomen is soft, nondistended, nontender to palpation.  Negative Murphy's sign.  MUSCULOSKELETAL:  Normal muscle strength and tone in all four extremities.  No peripheral edema or cyanosis. SKIN: Skin turgor is normal. There are no pathologic skin lesions.  NEUROLOGIC:  Motor and sensation is grossly normal.  Cranial nerves  are grossly intact. PSYCH:  Alert and oriented to person, place and time. Affect is normal.  Laboratory Analysis: Labs 11/15/17: Na 138, K 4.6, Cl 102, CO2 30, BUN 10, Cr 0.83.  T bili 0.4, AST 20, ALT 11, alk phos 180.  Imaging: U/S abdomen 11/23/17: IMPRESSION: 1. Cholelithiasis. Gallbladder wall is thickened at 5.4 mm. This may be from nondistended state. Cholecystitis cannot be excluded.  2. Questionable echogenic foci within the common bile duct. Common bile duct measures 6.4 mm in diameter. Question mild intrahepatic biliary ductal dilatation. If further evaluation is needed MRCP can be obtained.  Assessment and Plan: This is a 47 y.o. female with asymptomatic cholelithiasis, with elevated alkaline phosphatase and question of CBD filling defects.  I have independently viewed the patient's imaging study and reviewed her laboratory studies.  Her alk phos is mildly elevated but the rest of her LFTs are normal.  She has been asymptomatic completely.  Her ultrasound shows possible filling defects in the CBD.  Discussed with the patient that since she's asymptomatic and her LFTs were otherwise normal, we can continue the workup on an outpatient basis.  She needs an MRCP to fully evaluate the biliary tree to see if there're any CBD stones.  If there are, at this point they are non-obstructing.  If the MRCP is positive, then she would need referral to GI for ERCP and subsequent cholecystectomy.  We'll get the MRCP first and have her come back for follow up to discuss results and further plans.  The patient was given return precautions, particularly if she has any RUQ pain, nausea, vomiting, fevers, chills, jaundice, dark urine, to come to office or hospital for further evaluation. Patient understands this plan and is in agreement.  All her questions have been answered.  Face-to-face time spent with the patient and care providers was 60 minutes, with more than 50% of the time spent counseling,  educating, and coordinating care of the patient.     Melvyn Neth, [facility]

## 2017-11-24 NOTE — Progress Notes (Signed)
[facility] (arrive 4:30 pm). Prep: NPO 4 hours prior.   The patient will follow up in the office to discuss results with Dr. Aleen CampiPiscoya on 12-08-17 at 10 am.   The patient is aware of the above. She was instructed to call the office if she has further questions.

## 2017-11-24 NOTE — Patient Instructions (Addendum)
Patient will need to be scheduled for an MRCP to evaluate biliomy and return to the office after test is completed to see Dr.Piscoya.    Cholecystitis Cholecystitis is swelling and irritation (inflammation) of the gallbladder. The gallbladder is an organ that is shaped like a pear. It is under the liver on the right side of the body. This condition is often caused by gallstones. You doctor may do tests to see how your gallbladder works. These tests may include:  Imaging tests, such as: ? An ultrasound. ? MRI.  Tests that check how your liver works.  This condition needs treatment. Follow these instructions at home: Home care will depend on your treatment. In general:  Take over-the-counter and prescription medicines only as told by your doctor.  If you were prescribed an antibiotic medicine, take it as told by your doctor. Do not stop taking the antibiotic even if you start to feel better.  Follow instructions from your doctor about what to eat or drink. When you are allowed to eat, avoid eating or drinking anything that causes your symptoms to start.  Keep all follow-up visits as told by your doctor. This is important.  Contact a doctor if:  You have pain and your medicine does not help.  You have a fever. Get help right away if:  Your pain moves to: ? Another part of your belly (abdomen). ? Your back.  Your symptoms do not go away.  You have new symptoms. This information is not intended to replace advice given to you by your health care provider. Make sure you discuss any questions you have with your health care provider. Document Released: 12/11/2010 Document Revised: 05/30/2015 Document Reviewed: 04/04/2014 Elsevier Interactive Patient Education  2018 [employer].    Endoscopic Retrograde Cholangiopancreatogram Endoscopic retrograde cholangiopancreatogram (ERCP) is a procedure that may be used to diagnose or treat problems with the pancreas, bile ducts, liver, and  gallbladder. For this procedure, a thin, lighted tube (endoscope) is passed through the mouth, the throat, and down into the areas being checked. The endoscope has a camera that allows the areas to be viewed. Dye is injected and then X-rays are taken to further study the areas. During ERCP, other procedures may also be done to help diagnose or treat problems that are found. For example, stones can be removed, or a tissue sample can be taken out for testing (biopsy). Tell a health care provider about:  Any allergies you have.  All medicines you are taking, including vitamins, herbs, eye drops, creams, and over-the-counter medicines.  Any problems you or family members have had with anesthetic medicines.  Any blood disorders you have.  Any surgeries you have had.  Any medical conditions you have.  Whether you are pregnant or may be pregnant. What are the risks? Generally, this is a safe procedure. However, problems may occur, including:  Pancreatitis.  Infection.  Bleeding.  Allergic reactions to medicines or dyes.  Accidental punctures in the bowel wall, pancreas, or gallbladder.  Damage to other structures or organs.  What happens before the procedure? Staying hydrated Follow instructions from your health care provider about hydration, which may include:  Up to 2 hours before the procedure - you may continue to drink clear liquids, such as water, clear fruit juice, black coffee, and plain tea.  Eating and drinking restrictions Follow instructions from your health care provider about eating and drinking, which may include:  8 hours before the procedure - stop eating heavy meals or foods  such as meat, fried foods, or fatty foods.  6 hours before the procedure - stop eating light meals or foods, such as toast or cereal.  6 hours before the procedure - stop drinking milk or drinks that contain milk.  2 hours before the procedure - stop drinking clear liquids.  General  instructions  Ask your health care provider about: ? Changing or stopping your regular medicines. This is especially important if you are taking diabetes medicines or blood thinners. ? Taking medicines such as aspirin and ibuprofen. These medicines can thin your blood. Do not take these medicines before your procedure if your health care provider instructs you not to.  Plan to have someone take you home from the hospital or clinic.  If you will be going home right after the procedure, plan to have someone with you for 24 hours. What happens during the procedure?  To lower your risk of infection, your health care team will wash or sanitize their hands.  An IV tube will be inserted into one of your veins.  You will be given one or more of the following: ? A medicine to help you relax (sedative). ? A medicine to numb the throat area (local anesthetic) and prevent gagging. Your throat may be sprayed with this medicine, or you may gargle the medicine. ? A medicine to make you fall asleep (general anesthetic). ? A medicine to lower your risk of infection (antibiotic), inflammation (anti-inflammatory), or both.  You will lie on your left side.  The endoscope will be inserted through your mouth, down the back of the throat, and into the first part of the small intestine (duodenum).  Then a small, plastic tube (cannula) will be passed through the endoscope and directed into the bile duct or pancreatic duct.  Dye will be injected through the cannula to make structures easier to see on an X-ray.  X-rays will be taken to study the biliary and pancreatic passageways. You may be positioned on your abdomen or your back during the X-rays.  A small sample of tissue (biopsy) may be removed for examination, or other procedures may be done to fix problems that are found. The procedure may vary among health care providers and hospitals. What happens after the procedure?  Your blood pressure, heart  rate, breathing rate, and blood oxygen level will be monitored until the medicines you were given have worn off.  Your throat may feel slightly sore.  You will not be allowed to eat or drink until numbness subsides.  Do not drive for 24 hours if you were given a sedative. Summary  Endoscopic retrograde cholangiopancreatogram is a procedure that may be used to diagnose or treat problems with the pancreas, bile ducts, liver, and gallbladder.  During ERCP, other procedures may also be done to help diagnose or treat problems that are found. For example, stones can be removed, or a tissue sample can be taken out for testing (biopsy).  Generally, this is a safe procedure. However, problems may occur, including infection, bleeding, pancreatitis, accidental damage to other structures or organs, and allergic reactions to medicines or dyes.  The procedure may vary among health care providers and hospitals. This information is not intended to replace advice given to you by your health care provider. Make sure you discuss any questions you have with your health care provider. Document Released: 09/16/2000 Document Revised: 11/18/2015 Document Reviewed: 11/18/2015 Elsevier Interactive Patient Education  2017 [employer] Inc[employer]    Low-Fat Diet for Pancreatitis or Gallbladder  Conditions A low-fat diet can be helpful if you have pancreatitis or a gallbladder condition. With these conditions, your pancreas and gallbladder have trouble digesting fats. A healthy eating plan with less fat will help rest your pancreas and gallbladder and reduce your symptoms. What do I need to know about this diet?  Eat a low-fat diet. ? Reduce your fat intake to less than 20-30% of your total daily calories. This is less than 50-60 g of fat per day. ? Remember that you need some fat in your diet. Ask your dietician what your daily goal should be. ? Choose nonfat and low-fat healthy foods. Look for the words "nonfat," "low fat,"  or "fat free." ? As a guide, look on the label and choose foods with less than 3 g of fat per serving. Eat only one serving.  Avoid alcohol.  Do not smoke. If you need help quitting, talk with your health care provider.  Eat small frequent meals instead of three large heavy meals. What foods can I eat? Grains Include healthy grains and starches such as potatoes, wheat bread, fiber-rich cereal, and brown rice. Choose whole grain options whenever possible. In adults, whole grains should account for 45-65% of your daily calories. Fruits and Vegetables Eat plenty of fruits and vegetables. Fresh fruits and vegetables add fiber to your diet. Meats and Other Protein Sources Eat lean meat such as chicken and pork. Trim any fat off of meat before cooking it. Eggs, fish, and beans are other sources of protein. In adults, these foods should account for 10-35% of your daily calories. Dairy Choose low-fat milk and dairy options. Dairy includes fat and protein, as well as calcium. Fats and Oils Limit high-fat foods such as fried foods, sweets, baked goods, sugary drinks. Other Creamy sauces and condiments, such as mayonnaise, can add extra fat. Think about whether or not you need to use them, or use smaller amounts or low fat options. What foods are not recommended?  High fat foods, such as: ? [employer]aked goods. ? Ice cream. ? Jamaica[place] toast. ? Sweet rolls. ? Pizza. ? Cheese bread. ? Foods covered with batter, butter, creamy sauces, or cheese. ? Fried foods. ? Sugary drinks and desserts.  Foods that cause gas or bloating This information is not intended to replace advice given to you by your health care provider. Make sure you discuss any questions you have with your health care provider. Document Released: 12/27/2012 Document Revised: 05/30/2015 Document Reviewed: 12/05/2012 Elsevier Interactive Patient Education  2017 [employer]

## 2017-11-25 ENCOUNTER — Encounter: Payer: Self-pay | Admitting: Surgery

## 2017-11-29 ENCOUNTER — Encounter: Payer: Self-pay | Admitting: Primary Care

## 2017-11-29 ENCOUNTER — Ambulatory Visit (INDEPENDENT_AMBULATORY_CARE_PROVIDER_SITE_OTHER): Payer: Medicare Other | Admitting: Primary Care

## 2017-11-29 VITALS — BP 120/80 | HR 63 | Temp 98.1°F | Ht 62.0 in | Wt 261.5 lb

## 2017-11-29 DIAGNOSIS — G40909 Epilepsy, unspecified, not intractable, without status epilepticus: Secondary | ICD-10-CM | POA: Diagnosis not present

## 2017-11-29 NOTE — Progress Notes (Signed)
Subjective:    Patient ID: Teresa Cervantes, female    DOB: [birth year], 47 y.o.   MRN: [id number]  HPI  Teresa Cervantes is a 47 year old female who presents today for Dilantin level check. She is also scheduled for MRCP for further evaluation of chronic cholelithiasis.   She was last evaluated several weeks ago as a new patient, Dilantin level was too low so it was recommended to increase her current dose and return for repeat labs in 2 weeks.   Since her last visit she's compliant to her Dilantin three times daily, has missed 2-3 doses in the afternoon as she forgets to take. She has an appointment scheduled with her neurologist in January 2020. She denies seizures since her ED visit in August.   Review of Systems  Eyes: Negative for visual disturbance.  Gastrointestinal: Negative for abdominal pain.  Neurological: Negative for seizures.       Past Medical History:  Diagnosis Date  . Depression   . PTSD (post-traumatic stress disorder)   . Seizures (HCC)      Social History   Socioeconomic History  . Marital status: Significant Other    Spouse name: Not on file  . Number of children: 2  . Years of education: Not on file  . Highest education level: Not on file  Occupational History  . Not on file  Social Needs  . Financial resource strain: Not on file  . Food insecurity:    Worry: Not on file    Inability: Not on file  . Transportation needs:    Medical: Not on file    Non-medical: Not on file  Tobacco Use  . Smoking status: Never Smoker  . Smokeless tobacco: Never Used  Substance and Sexual Activity  . Alcohol use: Not Currently  . Drug use: Never  . Sexual activity: Not on file  Lifestyle  . Physical activity:    Days per week: Not on file    Minutes per session: Not on file  . Stress: Not on file  Relationships  . Social connections:    Talks on phone: Not on file    Gets together: Not on file    Attends religious service: Not on file    Active member of  club or organization: Not on file    Attends meetings of clubs or organizations: Not on file    Relationship status: Not on file  . Intimate partner violence:    Fear of current or ex partner: Not on file    Emotionally abused: Not on file    Physically abused: Not on file    Forced sexual activity: Not on file  Other Topics Concern  . Not on file  Social History Narrative   Single.   2 children.   Not working.   Enjoys spending time with friends.    Past Surgical History:  Procedure Laterality Date  . CESAREAN SECTION     X2    No family history on file.  Allergies  Allergen Reactions  . Penicillins Hives    Has patient had a PCN reaction causing immediate rash, facial/tongue/throat swelling, SOB or lightheadedness with hypotension: Y Has patient had a PCN reaction causing severe rash involving mucus membranes or skin necrosis: Y Has patient had a PCN reaction that required hospitalization: Y Has patient had a PCN reaction occurring within the last 10 years: Y If all of the above answers are "NO", then may proceed with Cephalosporin use.  Current Outpatient Medications on File Prior to Visit  Medication Sig Dispense Refill  . FLUoxetine (PROZAC) 20 MG capsule Take 1 capsule (20 mg total) by mouth daily. For depression. 90 capsule 3  . phenytoin (DILANTIN) 100 MG ER capsule Take 1 capsule (100 mg total) by mouth 3 (three) times daily. For seizure prevention. 90 capsule 0   No current facility-administered medications on file prior to visit.     BP 120/80   Pulse 63   Temp 98.1 F (36.7 C) (Oral)   Ht 5\' 2"  (1.575 m)   Wt 261 lb 8 oz (118.6 kg)   LMP 11/28/2017   SpO2 98%   BMI 47.83 kg/m    Objective:   Physical Exam  Constitutional: She is oriented to person, place, and time. She appears well-nourished.  Eyes: EOM are normal.  Neck: Neck supple.  Cardiovascular: Normal rate and regular rhythm.  Respiratory: Effort normal and breath sounds normal.    Neurological: She is alert and oriented to person, place, and time. No cranial nerve deficit.           Assessment & Plan:

## 2017-11-29 NOTE — Assessment & Plan Note (Signed)
Mostly compliant to phenytoin 100 mg TID. Repeat phenytoin levels pending today. Follow up with neurology in January as scheduled.

## 2017-11-29 NOTE — Patient Instructions (Addendum)
Stop by the lab prior to leaving today. I will notify you of your results once received.   Follow up with the surgeon as scheduled.  Follow up with neurology as scheduled.  It was a pleasure to see you today!

## 2017-11-30 DIAGNOSIS — G40909 Epilepsy, unspecified, not intractable, without status epilepticus: Secondary | ICD-10-CM

## 2017-11-30 LAB — PHENYTOIN LEVEL, TOTAL: Phenytoin, Total: 7.9 mg/L — ABNORMAL LOW (ref 10.0–20.0)

## 2017-11-30 MED ORDER — PHENYTOIN SODIUM EXTENDED 100 MG PO CAPS: ORAL_CAPSULE | ORAL | 0 refills | Status: DC

## 2017-12-03 ENCOUNTER — Ambulatory Visit (HOSPITAL_COMMUNITY): Payer: Medicare Other

## 2017-12-04 ENCOUNTER — Ambulatory Visit (HOSPITAL_COMMUNITY): Payer: Medicare Other

## 2017-12-04 ENCOUNTER — Encounter (HOSPITAL_COMMUNITY): Payer: Self-pay

## 2017-12-04 ENCOUNTER — Ambulatory Visit (HOSPITAL_COMMUNITY)
Admission: RE | Admit: 2017-12-04 | Discharge: 2017-12-04 | Disposition: A | Discharge status: Home / Self Care | Payer: Medicare Other | Source: Ambulatory Visit | Attending: Surgery | Admitting: Surgery

## 2017-12-04 DIAGNOSIS — R933 Abnormal findings on diagnostic imaging of other parts of digestive tract: Secondary | ICD-10-CM

## 2017-12-04 NOTE — Progress Notes (Signed)
Pt came to mri with 2 family memebers to get scan. Pt took prescribed meds prior to mri and still was very upset, claustro, throwing up and started thrashing on the table upon trying to enter mri suite. Pt did not have any more meds to take for study.  Pt could not lay still or flat long enough to start procedure and may require sedation for exam if necessary. Pt and family informed to contact pt Doctor.

## 2017-12-05 NOTE — Telephone Encounter (Signed)
Shirlee LimerickMarion or SterlingAnastasiya, I really need to get her neurology appointment moved up. Can we do this? Even if it's through [facility]? I'd like her seen as soon as possible.

## 2017-12-06 ENCOUNTER — Telehealth: Payer: Self-pay

## 2017-12-06 DIAGNOSIS — R933 Abnormal findings on diagnostic imaging of other parts of digestive tract: Secondary | ICD-10-CM

## 2017-12-06 NOTE — Addendum Note (Signed)
Addended by: Sinda DuKENNEDY, CARYL-LYN M on: 12/06/2017 11:57 AM   Modules accepted: Orders

## 2017-12-06 NOTE — Telephone Encounter (Signed)
Patient states that she was unable to complete the MRCP MRI due to the cave like nature of the MRI. She had a panic attack and states that the Xanax did not work. They told her at Radiology the only way she would be able to have this study is if she was fully sedated.  I will send a message to Dr Aleen CampiPiscoya about this and what he next step should be.

## 2017-12-06 NOTE — Telephone Encounter (Signed)
Spoke with Dr Aleen CampiPiscoya and he would like the patient to have a repeat U/S of her gallbladder and then come in to see him to discuss the next step in her care. The patient is amendable to this.  The patient is scheduled [facility] 12[facility]/17 at 10:00 am. [facility] 9:45 am and have nothing to eat or drink for 6 hours prior. The patient will follow up with Dr Aleen CampiPiscoya on 12/17/17 at 9:30 am. The patient is aware of dates, times, and instructions.

## 2017-12-06 NOTE — Telephone Encounter (Signed)
Patient [facility]. Message left for her to call back about rescheduling this. Also need to reschedule her follow up with DR Piscoya on 12/08/17.

## 2017-12-08 ENCOUNTER — Ambulatory Visit: Payer: Medicare Other | Admitting: Surgery

## 2017-12-08 NOTE — Telephone Encounter (Signed)
[facility] in regards to this patient and to see if appointment could be moved up. Just received message from [facility] stating they had a cancellation and patient is coming in Monday 12/13/2017 at 9:30 am to see Dr Teresa Cervantes. Patient aware per Teresa BandyNikki and Teresa RiegerKatherine Cervantes is advised also.

## 2017-12-08 NOTE — Telephone Encounter (Signed)
Any updates on a neurology appointment?

## 2017-12-10 ENCOUNTER — Ambulatory Visit
Admission: RE | Admit: 2017-12-10 | Discharge: 2017-12-10 | Disposition: A | Discharge status: Home / Self Care | Payer: Medicare Other | Source: Ambulatory Visit | Attending: Surgery | Admitting: Surgery

## 2017-12-10 DIAGNOSIS — K8020 Calculus of gallbladder without cholecystitis without obstruction: Secondary | ICD-10-CM | POA: Diagnosis not present

## 2017-12-10 DIAGNOSIS — R933 Abnormal findings on diagnostic imaging of other parts of digestive tract: Secondary | ICD-10-CM

## 2017-12-11 ENCOUNTER — Emergency Department (HOSPITAL_COMMUNITY): Payer: Medicare Other

## 2017-12-11 ENCOUNTER — Emergency Department (HOSPITAL_COMMUNITY)
Admission: EM | Admit: 2017-12-11 | Discharge: 2017-12-11 | Disposition: A | Discharge status: Home / Self Care | Payer: Medicare Other | Attending: Emergency Medicine | Admitting: Emergency Medicine

## 2017-12-11 ENCOUNTER — Encounter (HOSPITAL_COMMUNITY): Payer: Self-pay

## 2017-12-11 ENCOUNTER — Other Ambulatory Visit: Payer: Self-pay

## 2017-12-11 DIAGNOSIS — Z79899 Other long term (current) drug therapy: Secondary | ICD-10-CM | POA: Diagnosis not present

## 2017-12-11 DIAGNOSIS — R0789 Other chest pain: Secondary | ICD-10-CM | POA: Diagnosis not present

## 2017-12-11 DIAGNOSIS — R0602 Shortness of breath: Secondary | ICD-10-CM | POA: Insufficient documentation

## 2017-12-11 DIAGNOSIS — R079 Chest pain, unspecified: Secondary | ICD-10-CM

## 2017-12-11 LAB — BASIC METABOLIC PANEL
Anion gap: 7 (ref 5–15)
BUN: 9 mg/dL (ref 6–20)
CO2: 27 mmol/L (ref 22–32)
Calcium: 8.6 mg/dL — ABNORMAL LOW (ref 8.9–10.3)
Chloride: 104 mmol/L (ref 98–111)
Creatinine, Ser: 0.72 mg/dL (ref 0.44–1.00)
GFR calc Af Amer: >60 mL/min (ref >60)
GFR calc non Af Amer: >60 mL/min (ref >60)
Glucose, Bld: 94 mg/dL (ref 70–99)
Potassium: 4.3 mmol/L (ref 3.5–5.1)
Sodium: 138 mmol/L (ref 135–145)

## 2017-12-11 LAB — CBC
HCT: 39.1 % (ref 36.0–46.0)
Hemoglobin: 12.6 g/dL (ref 12.0–15.0)
MCH: 30.3 pg (ref 26.0–34.0)
MCHC: 32.2 g/dL (ref 30.0–36.0)
MCV: 94 fL (ref 80.0–100.0)
Platelets: 167 10*3/uL (ref 150–400)
RBC: 4.16 MIL/uL (ref 3.87–5.11)
RDW: 12.5 % (ref 11.5–15.5)
WBC: 8.5 10*3/uL (ref 4.0–10.5)
nRBC: 0 % (ref 0.0–0.2)

## 2017-12-11 LAB — POC URINE PREG, ED: Preg Test, Ur: NEGATIVE

## 2017-12-11 LAB — TROPONIN I: Troponin I: <0.03 ng/mL (ref <0.03)

## 2017-12-11 MED ORDER — IBUPROFEN 200 MG PO TABS
600.0000 mg | ORAL_TABLET | Freq: Once | ORAL | Status: AC
  Administered 2017-12-11: 600 mg via ORAL
  Filled 2017-12-11: qty 3

## 2017-12-11 MED ORDER — LIDOCAINE VISCOUS HCL 2 % MT SOLN
15.0000 mL | Freq: Once | OROMUCOSAL | Status: AC
  Administered 2017-12-11: 15 mL via ORAL
  Filled 2017-12-11: qty 15

## 2017-12-11 MED ORDER — ALUM & MAG HYDROXIDE-SIMETH 200-200-20 MG/5ML PO SUSP
30.0000 mL | Freq: Once | ORAL | Status: AC
  Administered 2017-12-11: 30 mL via ORAL
  Filled 2017-12-11: qty 30

## 2017-12-11 MED ORDER — PROMETHAZINE HCL 25 MG PO TABS
25.0000 mg | ORAL_TABLET | Freq: Once | ORAL | Status: AC
  Administered 2017-12-11: 25 mg via ORAL
  Filled 2017-12-11: qty 1

## 2017-12-11 MED ORDER — OXYCODONE-ACETAMINOPHEN 5-325 MG PO TABS
1.0000 | ORAL_TABLET | Freq: Once | ORAL | Status: AC
  Administered 2017-12-11: 1 via ORAL
  Filled 2017-12-11: qty 1

## 2017-12-11 NOTE — ED Triage Notes (Signed)
Pt reports she had chest tightness/ pressure  that began this am. Pt reports the pain at the bra line. Pt reports there was SOB associated with the pain but has subsided some. Pt denies radiation.

## 2017-12-11 NOTE — ED Notes (Signed)
EDP at bedside  

## 2017-12-13 ENCOUNTER — Ambulatory Visit: Payer: Medicare Other | Admitting: Neurology

## 2017-12-13 ENCOUNTER — Telehealth: Payer: Self-pay | Admitting: *Deleted

## 2017-12-13 NOTE — Telephone Encounter (Signed)
-----   Message from Jose Piscoya, MD sent at[facility] 12/10/2017  2:50 PM EST ----- Regarding: U/S results Hi Red!  This patient just had her repeat ultrasound done.  Her findings are better today compared to last time, and there is less concern for any potential CBD stone.  So overall, she does not need the MRI that we had previously tried to obtain.  Can you please let the patient know?  Since she was also asymptomatic, she may follow up with us on an as needed basis.   Thanks!  Visteon CorporationJose

## 2017-12-13 NOTE — Telephone Encounter (Addendum)
Called patient to inform her of the US results per Dr.Piscoya.    Patient has been informed of her US results she will cancel upcoming appointment and call the office with any questions or concerns.

## 2017-12-13 NOTE — Telephone Encounter (Signed)
Pt left appt d/t being unable to pay any portion of copay.

## 2017-12-14 ENCOUNTER — Encounter: Payer: Self-pay | Admitting: Neurology

## 2017-12-17 ENCOUNTER — Ambulatory Visit: Payer: Medicare Other | Admitting: Surgery
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
## 2017-12-20 ENCOUNTER — Telehealth: Payer: Self-pay

## 2017-12-20 NOTE — Addendum Note (Signed)
Addended by: Nicholes MangoBAILEY, Davarion Cuffee J on: 12/20/2017 01:20 PM   Modules accepted: Orders

## 2017-12-20 NOTE — Telephone Encounter (Signed)
Pt called to get # to reactivate my chart acct; pt given [phone] to contact my chart. Pt has already requested refill dilantin from pharmacy.pt said she still has enough for about 1 more month but did not want to be out of med.

## 2017-12-21 ENCOUNTER — Other Ambulatory Visit: Payer: Medicare Other

## 2017-12-21 ENCOUNTER — Other Ambulatory Visit (INDEPENDENT_AMBULATORY_CARE_PROVIDER_SITE_OTHER): Payer: Medicare Other

## 2017-12-21 DIAGNOSIS — G40909 Epilepsy, unspecified, not intractable, without status epilepticus: Secondary | ICD-10-CM

## 2017-12-22 LAB — PHENYTOIN LEVEL, TOTAL: Phenytoin, Total: 13.3 mg/L (ref 10.0–20.0)

## 2017-12-24 ENCOUNTER — Ambulatory Visit: Payer: Medicare Other | Admitting: Surgery

## 2018-01-11 ENCOUNTER — Ambulatory Visit (INDEPENDENT_AMBULATORY_CARE_PROVIDER_SITE_OTHER): Payer: Medicare Other | Admitting: Primary Care

## 2018-01-11 ENCOUNTER — Encounter: Payer: Self-pay | Admitting: Primary Care

## 2018-01-11 ENCOUNTER — Encounter: Payer: Self-pay | Admitting: *Deleted

## 2018-01-11 VITALS — BP 122/82 | HR 62 | Temp 98.2°F | Ht 62.0 in | Wt 257.8 lb

## 2018-01-11 DIAGNOSIS — G40909 Epilepsy, unspecified, not intractable, without status epilepticus: Secondary | ICD-10-CM | POA: Diagnosis not present

## 2018-01-11 DIAGNOSIS — R8290 Unspecified abnormal findings in urine: Secondary | ICD-10-CM

## 2018-01-11 DIAGNOSIS — F3342 Major depressive disorder, recurrent, in full remission: Secondary | ICD-10-CM | POA: Diagnosis not present

## 2018-01-11 DIAGNOSIS — R5383 Other fatigue: Secondary | ICD-10-CM

## 2018-01-11 DIAGNOSIS — Z113 Encounter for screening for infections with a predominantly sexual mode of transmission: Secondary | ICD-10-CM | POA: Diagnosis not present

## 2018-01-11 LAB — POC URINALSYSI DIPSTICK (AUTOMATED)
Bilirubin, UA: NEGATIVE
Glucose, UA: NEGATIVE
Ketones, UA: NEGATIVE
NITRITE UA: NEGATIVE
Protein, UA: NEGATIVE
Spec Grav, UA: 1.025 (ref 1.010–1.025)
Urobilinogen, UA: 0.2 E.U./dL
pH, UA: 6 (ref 5.0–8.0)

## 2018-01-11 LAB — VITAMIN D 25 HYDROXY (VIT D DEFICIENCY, FRACTURES): VITD: 25.87 ng/mL — ABNORMAL LOW (ref 30.00–100.00)

## 2018-01-11 LAB — VITAMIN B12: Vitamin B-12: 184 pg/mL — ABNORMAL LOW (ref 211–911)

## 2018-01-11 LAB — TSH: TSH: 2.66 u[IU]/mL (ref 0.35–4.50)

## 2018-01-11 MED ORDER — FLUOXETINE HCL 40 MG PO CAPS: 40.0000 mg | ORAL_CAPSULE | Freq: Every day | ORAL | 0 refills | Status: DC

## 2018-01-11 NOTE — Assessment & Plan Note (Signed)
Asymptomatic. Labs pending. 

## 2018-01-11 NOTE — Progress Notes (Signed)
Subjective:    Patient ID: Karlton Lemon, female    DOB: [birth year], 48 y.o.   MRN: [id number]  HPI  Ms. Wardrop is a 48 year old female who presents today with a chief complaint of dark, foul smelling urine. She would also like STD testing.  She's sent several my chart messages in late December/early January with reports of dark colored and foul smelling urine. Her symptoms began 1-2 weeks ago.  She was evaluated one week ago at the [facility] ED for symptoms, was placed on one week of Cipro BID. She completed her last pill yesterday. Since treatment she's noticed improvement in her symptoms.    She denies fevers, dysuria. She completed her menstrual cycle one week ago. She does notice fatigue, has difficulty sleeping, also hot flashes, feeling down, little motivation to do anything. She is managed on fluoxetine 20 mg for which she's taken for years, doesn't feel as though it's as effective. PHQ 9 score of 15.   She has an appointment with her neurologist in a few weeks. Did have one seizure on New Year's Day. She is compliant to her Dilantin as prescribed.  Review of Systems  Constitutional: Positive for fatigue. Negative for fever.  Respiratory: Negative for shortness of breath.   Cardiovascular: Negative for chest pain.  Genitourinary: Negative for dysuria, frequency and pelvic pain.       Hot flashes  Neurological:       Seizure on January 05, 2018.  Psychiatric/Behavioral:       See HPI       Past Medical History:  Diagnosis Date  . Depression   . PTSD (post-traumatic stress disorder)   . Seizures (HCC)      Social History   Socioeconomic History  . Marital status: Significant Other    Spouse name: Not on file  . Number of children: 2  . Years of education: Not on file  . Highest education level: Not on file  Occupational History  . Not on file  Social Needs  . Financial resource strain: Not on file  . Food insecurity:    Worry: Not on file   Inability: Not on file  . Transportation needs:    Medical: Not on file    Non-medical: Not on file  Tobacco Use  . Smoking status: Never Smoker  . Smokeless tobacco: Never Used  Substance and Sexual Activity  . Alcohol use: Not Currently  . Drug use: Never  . Sexual activity: Not on file  Lifestyle  . Physical activity:    Days per week: Not on file    Minutes per session: Not on file  . Stress: Not on file  Relationships  . Social connections:    Talks on phone: Not on file    Gets together: Not on file    Attends religious service: Not on file    Active member of club or organization: Not on file    Attends meetings of clubs or organizations: Not on file    Relationship status: Not on file  . Intimate partner violence:    Fear of current or ex partner: Not on file    Emotionally abused: Not on file    Physically abused: Not on file    Forced sexual activity: Not on file  Other Topics Concern  . Not on file  Social History Narrative   Single.   2 children.   Not working.   Enjoys spending time with friends.  Past Surgical History:  Procedure Laterality Date  . CESAREAN SECTION     X2    No family history on file.  Allergies  Allergen Reactions  . Penicillins Hives    Has patient had a PCN reaction causing immediate rash, facial/tongue/throat swelling, SOB or lightheadedness with hypotension: Y Has patient had a PCN reaction causing severe rash involving mucus membranes or skin necrosis: Y Has patient had a PCN reaction that required hospitalization: Y Has patient had a PCN reaction occurring within the last 10 years: Y If all of the above answers are "NO", then may proceed with Cephalosporin use.     Current Outpatient Medications on File Prior to Visit  Medication Sig Dispense Refill  . FLUoxetine (PROZAC) 20 MG capsule Take 1 capsule (20 mg total) by mouth daily. For depression. 90 capsule 3  . phenytoin (DILANTIN) 100 MG ER capsule Take 2 capsules in  the morning and 1 capsule in the evening for seizure prevention. (Patient taking differently: Take 100 mg by mouth 3 (three) times daily. ) 90 capsule 0   No current facility-administered medications on file prior to visit.     BP 122/82   Pulse 62   Temp 98.2 F (36.8 C) (Oral)   Ht 5\' 2"  (1.575 m)   Wt 257 lb 12 oz (116.9 kg)   LMP 01/02/2018   SpO2 98%   BMI 47.14 kg/m    Objective:   Physical Exam  Constitutional: She appears well-nourished.  Neck: Neck supple.  Cardiovascular: Normal rate and regular rhythm.  Respiratory: Effort normal and breath sounds normal.  Skin: Skin is warm and dry.  Psychiatric: She has a normal mood and affect.           Assessment & Plan:  Dark Colored Urine:  Present for 2 weeks, recently treated for UTI and completed antibiotics. UA today with 1+ leuks, 1+ blood, negative nitrites. Culture sent off.  Encouraged water intake. Given improvement in symptoms, will wait for culture results. Consider further work up for hematuria if needed.   Doreene NestKatherine K Retha Bither, NP

## 2018-01-11 NOTE — Assessment & Plan Note (Signed)
Breakthrough seizure on January 05, 2018.  Has an appointment with neurology in a few weeks. Continue current regimen.

## 2018-01-11 NOTE — Assessment & Plan Note (Addendum)
Symptoms today could very well be secondary to uncontrolled depression. PHQ 9 score of 15 today. We will start with an increase in fluoxetine to 40 mg. Also check TSH, Vitamin D and B12 to rule out other causes.  She will update in a few weeks.

## 2018-01-11 NOTE — Addendum Note (Signed)
Addended by: Tawnya Crook on: 01/11/2018 12:35 PM   Modules accepted: Orders

## 2018-01-11 NOTE — Patient Instructions (Signed)
Stop by the lab prior to leaving today. I will notify you of your results once received.   Come back to provide [place] with a urine specimen when you can.  Follow up with the neurologist as scheduled.  We've increased the dose of your fluoxetine to 40 mg. You may take two of the 20 mg to make 40 mg until your bottle is empty. Pick up the new prescription at your pharmacy.  Please update me in one month regarding your depression and other symptoms.  It was a pleasure to see you today!

## 2018-01-12 ENCOUNTER — Other Ambulatory Visit: Payer: Self-pay | Admitting: Primary Care

## 2018-01-12 DIAGNOSIS — E538 Deficiency of other specified B group vitamins: Secondary | ICD-10-CM

## 2018-01-12 DIAGNOSIS — E559 Vitamin D deficiency, unspecified: Secondary | ICD-10-CM

## 2018-01-12 LAB — URINE CULTURE
MICRO NUMBER:: 21885
SPECIMEN QUALITY:: ADEQUATE

## 2018-01-14 LAB — HEPATITIS C ANTIBODY
HEP C AB: NONREACTIVE
SIGNAL TO CUT-OFF: 0.45 (ref <1.00)

## 2018-01-14 LAB — HSV(HERPES SIMPLEX VRS) I + II AB-IGG
HAV 1 IGG,TYPE SPECIFIC AB: 42.9 index — ABNORMAL HIGH
HSV 2 IGG,TYPE SPECIFIC AB: 1.89 index — ABNORMAL HIGH

## 2018-01-14 LAB — RPR: RPR Ser Ql: NONREACTIVE

## 2018-01-14 LAB — HIV ANTIBODY (ROUTINE TESTING W REFLEX): HIV 1&2 Ab, 4th Generation: NONREACTIVE

## 2018-01-14 LAB — HSV 1/2 AB (IGM), IFA W/RFLX TITER
HSV 1 IgM Screen: NEGATIVE
HSV 2 IgM Screen: NEGATIVE

## 2018-01-25 ENCOUNTER — Ambulatory Visit: Payer: Medicare Other | Admitting: Neurology

## 2018-02-03 ENCOUNTER — Encounter: Payer: Self-pay | Admitting: Neurology

## 2018-02-03 ENCOUNTER — Telehealth: Payer: Self-pay | Admitting: Neurology

## 2018-02-03 ENCOUNTER — Ambulatory Visit: Payer: Medicare Other | Admitting: Neurology

## 2018-02-03 VITALS — BP 123/72 | HR 78 | Ht 62.5 in | Wt 258.0 lb

## 2018-02-03 DIAGNOSIS — R569 Unspecified convulsions: Secondary | ICD-10-CM | POA: Diagnosis not present

## 2018-02-03 DIAGNOSIS — G9340 Encephalopathy, unspecified: Secondary | ICD-10-CM

## 2018-02-03 DIAGNOSIS — R419 Unspecified symptoms and signs involving cognitive functions and awareness: Secondary | ICD-10-CM

## 2018-02-03 NOTE — Patient Instructions (Signed)
CT of the head EEG in the office Lab   Seizure, Adult A seizure is a sudden burst of abnormal electrical activity in the brain. The abnormal activity temporarily interrupts normal brain function, causing a person to experience any of the following:  Involuntary movements.  Changes in awareness or consciousness.  Uncontrollable shaking (convulsions). Seizures usually last from 30 seconds to 2 minutes. They usually do not cause permanent brain damage unless they are prolonged. What are the causes? This condition may be caused by:  Fever.  Low blood sugar.  Medicine.  Illness.  Brain injury.  Brain tumor.  Stroke.  A condition that is passed from parent to child (genetic).  Addiction to a substance (substanceuse disorder) or suddenly stopping the use of a substance (withdrawal). Some people who have a seizure never have another one. People who have repeated seizures have a condition called epilepsy. What are the signs or symptoms? Symptoms of this condition vary greatly from person to person. They include:  Convulsions.  Stiffening of the body.  Involuntary movements of the arms or legs.  Loss of consciousness.  Breathing problems.  Falling suddenly.  Confusion.  Head nodding.  Eye blinking or fluttering.  Lip smacking or tongue biting.  Drooling.  Rapid eye movements.  Grunting.  Loss of bladder control and bowel control.  Staring.  Unresponsiveness. Some people have symptoms right before a seizure happens (aura) and right after a seizure happens.  Symptoms that may occur before a seizure include: ? Fear or anxiety. ? Nausea. ? Feeling like the room is spinning (vertigo). ? A feeling of having seen or heard something before (dj vu). ? Odd tastes or smells. ? Changes in vision, such as seeing flashing lights or spots.  Symptoms that may occur after a seizure include: ? Confusion. ? Sleepiness. ? Headache. ? Weakness on one side of the  body. How is this diagnosed? This condition may be diagnosed based on medical history and physical exam. You may also have other tests, including:  Blood tests.  Electroencephalogram, EEG.  CT scan.  MRI.  Spinal tap (lumbar puncture). How is this treated? Most seizures will stop on their own in under 5 minutes and no treatment is needed. Seizures lasting longer than 5 minutes will usually need treatment. Treatment includes:  Medicines given through IV.  Avoiding known triggers, such as medicines that you take for another condition.  Medicines to treat epilepsy (antiepileptics), if epilepsy caused your seizures.  Surgery to stop seizures, if you have epilepsy that does not respond to medicines. Follow these instructions at home: Medicines  Take over-the-counter and prescription medicines only as told by your health care provider.  Avoid any substances that may prevent your medicine from working properly, such as alcohol. Activity  Do not drive, swim, or do any other activities that would be dangerous if you had another seizure. Wait until your health care provider says it is safe to do them.  If you live in the [place] check with your local DMV (department of motor vehicles) to find out about local driving laws. Each state has specific rules about when you can legally return to driving.  Get enough rest. Lack of sleep can make seizures more likely to occur. Educating others Teach friends and family what to do if you have a seizure. They should:  Lay you on the ground to prevent a fall.  Cushion your head and body.  Loosen any tight clothing around your neck.  Turn you on your  side. If vomiting occurs, this helps keep your airway clear.  Not hold you down. Holding you down will not stop the seizure.  Not put anything into your mouth.  Know whether or not you need emergency care.  Stay with you until you recover.  General instructions  Contact your health care  provider each time you have a seizure.  Avoid anything that has ever triggered a seizure for you.  Keep a seizure diary. Record what you remember about each seizure, especially anything that might have triggered the seizure.  Keep all follow-up visits as told by your health care provider. This is important. Contact a health care provider if:  You have another seizure.  You have seizures more often.  Your seizure symptoms change.  You continue to have seizures with treatment.  You have symptoms of an infection or illness. These might increase your risk of having a seizure. Get help right away if:  You have a seizure that: ? Lasts longer than 5 minutes. ? Is different than previous seizures. ? Leaves you unable to speak or use a part of your body. ? Makes it harder to breathe.  You have: ? A seizure after a head injury. ? Multiple seizures in a row. ? Confusion or a severe headache right after a seizure.  You are having seizures more often.  You do not wake up immediately after a seizure.  You injure yourself during a seizure. These symptoms may represent a serious problem that is an emergency. Do not wait to see if the symptoms will go away. Get medical help right away. Call your local emergency services (911 in the [place] Do not drive yourself to the hospital. Summary  Seizures are caused by abnormal electrical activity in the brain. The activity disrupts normal brain function, leading to a change in consciousness, abnormal movements, or convulsions.  There are many causes of seizures including illnesses, medicines, genetic conditions, head injuries, strokes, tumors, substance abuse, or substance withdrawal.  Most seizures will stop on their own in under 5 minutes. Seizures lasting longer than 5 minutes are a medical emergency and require immediate treatment.  There are many medicines that are used to treat seizures. Take over-the-counter and prescription medicines only  as told by your health care provider. This information is not intended to replace advice given to you by your health care provider. Make sure you discuss any questions you have with your health care provider. Document Released: 12/20/1999 Document Revised: 01/28/2017 Document Reviewed: 01/28/2017 Elsevier Interactive Patient Education  2019 [employer].
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
## 2018-02-03 NOTE — Telephone Encounter (Signed)
[employer] medicare order sent to [facility] for pt to be aware. I left GI phone number of [phone] and to give them a call if she has not heard from them in the next 2-3 business days.  °

## 2018-02-04 LAB — COMPREHENSIVE METABOLIC PANEL
ALBUMIN: 4.2 g/dL (ref 3.8–4.8)
ALT: 9 IU/L (ref 0–32)
AST: 18 IU/L (ref 0–40)
Albumin/Globulin Ratio: 1.6 (ref 1.2–2.2)
Alkaline Phosphatase: 171 IU/L — ABNORMAL HIGH (ref 39–117)
BUN/Creatinine Ratio: 11 (ref 9–23)
BUN: 8 mg/dL (ref 6–24)
Bilirubin Total: 0.2 mg/dL (ref 0.0–1.2)
CO2: 24 mmol/L (ref 20–29)
Calcium: 8.7 mg/dL (ref 8.7–10.2)
Chloride: 101 mmol/L (ref 96–106)
Creatinine, Ser: 0.74 mg/dL (ref 0.57–1.00)
GFR calc Af Amer: 112 mL/min/{1.73_m2} (ref >59)
GFR calc non Af Amer: 97 mL/min/{1.73_m2} (ref >59)
GLOBULIN, TOTAL: 2.7 g/dL (ref 1.5–4.5)
Glucose: 91 mg/dL (ref 65–99)
Potassium: 4.5 mmol/L (ref 3.5–5.2)
Sodium: 139 mmol/L (ref 134–144)
Total Protein: 6.9 g/dL (ref 6.0–8.5)

## 2018-02-04 LAB — CBC
Hematocrit: 37.7 % (ref 34.0–46.6)
Hemoglobin: 12.7 g/dL (ref 11.1–15.9)
MCH: 30.4 pg (ref 26.6–33.0)
MCHC: 33.7 g/dL (ref 31.5–35.7)
MCV: 90 fL (ref 79–97)
Platelets: 174 10*3/uL (ref 150–450)
RBC: 4.18 x10E6/uL (ref 3.77–5.28)
RDW: 13.2 % (ref 11.7–15.4)
WBC: 7.2 10*3/uL (ref 3.4–10.8)

## 2018-02-04 LAB — PHENYTOIN LEVEL, TOTAL: Phenytoin (Dilantin), Serum: 4.6 ug/mL — ABNORMAL LOW (ref 10.0–20.0)

## 2018-02-14 ENCOUNTER — Encounter: Payer: Self-pay | Admitting: Surgery

## 2018-02-14 ENCOUNTER — Ambulatory Visit
Admission: RE | Admit: 2018-02-14 | Discharge: 2018-02-14 | Disposition: A | Discharge status: Home / Self Care | Payer: Medicare Other | Source: Ambulatory Visit | Attending: Neurology | Admitting: Neurology

## 2018-02-14 DIAGNOSIS — R569 Unspecified convulsions: Secondary | ICD-10-CM

## 2018-02-14 DIAGNOSIS — G9340 Encephalopathy, unspecified: Secondary | ICD-10-CM

## 2018-02-14 MED ORDER — IOPAMIDOL (ISOVUE-300) INJECTION 61%
75.0000 mL | Freq: Once | INTRAVENOUS | Status: AC | PRN
  Administered 2018-02-14: 75 mL via INTRAVENOUS

## 2018-02-14 NOTE — Telephone Encounter (Signed)
See my chart message. Will you please send what we have?

## 2018-02-16 ENCOUNTER — Telehealth: Payer: Self-pay | Admitting: Surgery

## 2018-02-16 NOTE — Telephone Encounter (Signed)
Patient sent a message through MyChart:  Subject: Visit Follow-Up Question               Could you please send copies of my medical records to my new address the address is [address] [place] [address]

## 2018-03-07 ENCOUNTER — Other Ambulatory Visit: Payer: Self-pay | Admitting: Neurology

## 2018-03-07 DIAGNOSIS — G40909 Epilepsy, unspecified, not intractable, without status epilepticus: Secondary | ICD-10-CM

## 2018-03-07 MED ORDER — PHENYTOIN SODIUM EXTENDED 100 MG PO CAPS: ORAL_CAPSULE | ORAL | 1 refills | Status: DC

## 2018-03-09 ENCOUNTER — Other Ambulatory Visit: Payer: Medicare Other

## 2018-03-21 DIAGNOSIS — F3342 Major depressive disorder, recurrent, in full remission: Secondary | ICD-10-CM

## 2018-03-28 MED ORDER — FLUOXETINE HCL 40 MG PO CAPS: 40.0000 mg | ORAL_CAPSULE | Freq: Every day | ORAL | 2 refills | Status: AC

## 2018-04-04 ENCOUNTER — Other Ambulatory Visit: Payer: Medicare Other

## 2018-04-04 NOTE — Telephone Encounter (Signed)
Spoke with Weston Brass. He will call the pt today to reschedule her EEG.

## 2018-04-14 ENCOUNTER — Other Ambulatory Visit: Payer: Self-pay | Admitting: *Deleted

## 2018-04-14 DIAGNOSIS — G40909 Epilepsy, unspecified, not intractable, without status epilepticus: Secondary | ICD-10-CM

## 2018-04-14 DIAGNOSIS — F3342 Major depressive disorder, recurrent, in full remission: Secondary | ICD-10-CM

## 2018-04-14 MED ORDER — PHENYTOIN SODIUM EXTENDED 100 MG PO CAPS: ORAL_CAPSULE | ORAL | 0 refills | Status: DC

## 2018-04-18 ENCOUNTER — Other Ambulatory Visit: Payer: Self-pay | Admitting: *Deleted

## 2018-04-18 DIAGNOSIS — G40909 Epilepsy, unspecified, not intractable, without status epilepticus: Secondary | ICD-10-CM

## 2018-04-18 MED ORDER — PHENYTOIN SODIUM EXTENDED 100 MG PO CAPS: ORAL_CAPSULE | ORAL | 0 refills | Status: DC

## 2018-04-18 NOTE — Telephone Encounter (Signed)
Called [facility] on [address] in [place], spoke with [place], and canceled the Phenytoin 100 mg ER as pt has moved. Phenytoin refill from 4/9 changed to [facility] in [place] on [address].

## 2018-04-19 ENCOUNTER — Other Ambulatory Visit: Payer: Self-pay

## 2018-05-05 NOTE — Telephone Encounter (Signed)
Please schedule patient for virtual visit.  Thanks! 

## 2018-05-06 ENCOUNTER — Encounter: Payer: Medicare Other | Admitting: Primary Care

## 2018-05-06 NOTE — Progress Notes (Deleted)
   Subjective:    Patient ID: Teresa Cervantes, female    DOB: Apr 23, 1970, 48 y.o.   MRN: [id number]  HPI  Virtual Visit via Video Note  I connected with Teresa Cervantes on 05/06/18 at  8:00 AM EDT by a video enabled telemedicine application and verified that I am speaking with the correct person using two identifiers.  Location: Patient: Home Provider: Office   I discussed the limitations of evaluation and management by telemedicine and the availability of in person appointments. The patient expressed understanding and agreed to proceed.  History of Present Illness:  Ms. Teresa Cervantes is a 48 year old female who presents today with a chief complaint of    Observations/Objective:   Assessment and Plan:   Follow Up Instructions:    I discussed the assessment and treatment plan with the patient. The patient was provided an opportunity to ask questions and all were answered. The patient agreed with the plan and demonstrated an understanding of the instructions.   The patient was advised to call back or seek an in-person evaluation if the symptoms worsen or if the condition fails to improve as anticipated.  I provided *** minutes of non-face-to-face time during this encounter.   Doreene Nest, NP    Review of Systems     Objective:   Physical Exam         Assessment & Plan:

## 2018-05-09 ENCOUNTER — Encounter: Payer: Self-pay | Admitting: Primary Care

## 2018-05-09 ENCOUNTER — Ambulatory Visit: Payer: Medicare Other | Admitting: Primary Care

## 2018-05-09 NOTE — Progress Notes (Signed)
Patient never showed up for appointment

## 2018-05-11 ENCOUNTER — Ambulatory Visit: Payer: Medicare Other | Admitting: Primary Care

## 2018-05-11 ENCOUNTER — Ambulatory Visit (INDEPENDENT_AMBULATORY_CARE_PROVIDER_SITE_OTHER): Payer: Medicaid Other | Admitting: Primary Care

## 2018-05-11 DIAGNOSIS — J302 Other seasonal allergic rhinitis: Secondary | ICD-10-CM | POA: Diagnosis not present

## 2018-05-11 NOTE — Progress Notes (Signed)
Subjective:    Patient ID: Teresa Cervantes, female    DOB: [birth year], 48 y.o.   MRN: [id number]  HPI  Virtual Visit via Video Note  I connected with Teresa Cervantes on 05/11/18 at  2:40 PM EDT by a video enabled telemedicine application and verified that I am speaking with the correct person using two identifiers.  Location: Patient: Home Provider: Office   I discussed the limitations of evaluation and management by telemedicine and the availability of in person appointments. The patient expressed understanding and agreed to proceed.  History of Present Illness:  [name] is a 48 year old female who presents today with a chief complaint of cough.  She also reports sore throat. Her symptoms began 3 days ago with a sore throat and voice hoarsness. She's been using halls cough drips, liquid gel caps without improvement. She denies fevers, chills, shortness of breath, fatigue, body aches, esophageal burning. She's had some post nasal drip and rhinorrhea.    Observations/Objective:  Alert and oriented. Appears well, not sickly. No distress. Speaking in complete sentences.   Assessment and Plan:  Suspect allergy involvement based off of HPI and presentation.  She doesn't appear sickly or ill. Low suspicion for Covid-19. Discussed to start OTC antihistamine such as Claritin, Allegra, Zyrtec. Also discussed Flonase if needed.  She will update if she develops fevers, SOB, chills, etc. At that point we would consider her for Covid testing. She understood.  Follow Up Instructions:  Your symptoms are suggestive of seasonal allergies.  Start taking an allergy pill like Claritin, Allegra, or Zyrtec. Get the "off" brand, it works just as well.  Please notify us if you develop fevers, chills, body aches, shortness of breath.  It was a pleasure to see you today! Teresa Reel, NP-C    I discussed the assessment and treatment plan with the patient. The patient was provided an  opportunity to ask questions and all were answered. The patient agreed with the plan and demonstrated an understanding of the instructions.   The patient was advised to call back or seek an in-person evaluation if the symptoms worsen or if the condition fails to improve as anticipated.    Doreene Nest, NP    Review of Systems  Constitutional: Negative for chills, fatigue and fever.  HENT: Positive for postnasal drip and rhinorrhea. Negative for congestion, ear pain and sore throat.   Respiratory: Positive for cough. Negative for shortness of breath.   Cardiovascular: Negative for chest pain.  Allergic/Immunologic: Positive for environmental allergies.       Past Medical History:  Diagnosis Date  . Depression   . PTSD (post-traumatic stress disorder)   . Seizures (HCC)      Social History   Socioeconomic History  . Marital status: Significant Other    Spouse name: Not on file  . Number of children: 2  . Years of education: Not on file  . Highest education level: High school graduate  Occupational History  . Not on file  Social Needs  . Financial resource strain: Not on file  . Food insecurity:    Worry: Not on file    Inability: Not on file  . Transportation needs:    Medical: Not on file    Non-medical: Not on file  Tobacco Use  . Smoking status: Never Smoker  . Smokeless tobacco: Never Used  Substance and Sexual Activity  . Alcohol use: Not Currently  . Drug use: Never  . Sexual activity: Not  on file  Lifestyle  . Physical activity:    Days per week: Not on file    Minutes per session: Not on file  . Stress: Not on file  Relationships  . Social connections:    Talks on phone: Not on file    Gets together: Not on file    Attends religious service: Not on file    Active member of club or organization: Not on file    Attends meetings of clubs or organizations: Not on file    Relationship status: Not on file  . Intimate partner violence:    Fear of  current or ex partner: Not on file    Emotionally abused: Not on file    Physically abused: Not on file    Forced sexual activity: Not on file  Other Topics Concern  . Not on file  Social History Narrative   Lives at home with her boyfriend    2 children.   Not working.   Enjoys spending time with friends and family.      Left handed   Caffeine:1-2 cups daily    Past Surgical History:  Procedure Laterality Date  . CESAREAN SECTION     X2  . TUBAL LIGATION      Family History  Problem Relation Age of Onset  . Seizures Neg Hx     Allergies  Allergen Reactions  . Penicillins Hives    Has patient had a PCN reaction causing immediate rash, facial/tongue/throat swelling, SOB or lightheadedness with hypotension: Y Has patient had a PCN reaction causing severe rash involving mucus membranes or skin necrosis: Y Has patient had a PCN reaction that required hospitalization: Y Has patient had a PCN reaction occurring within the last 10 years: Y If all of the above answers are "NO", then may proceed with Cephalosporin use.     Current Outpatient Medications on File Prior to Visit  Medication Sig Dispense Refill  . FLUoxetine (PROZAC) 40 MG capsule Take 1 capsule (40 mg total) by mouth daily. For depression. 90 capsule 2  . phenytoin (DILANTIN) 100 MG ER capsule Take 2 capsules in the morning and 1 capsule in the evening for seizure prevention. 90 capsule 0   No current facility-administered medications on file prior to visit.     There were no vitals taken for this visit.   Objective:   Physical Exam  Constitutional: She is oriented to person, place, and time. She appears well-nourished. She does not have a sickly appearance. She does not appear ill.  Respiratory: Effort normal. No respiratory distress.  No cough during exam  Neurological: She is alert and oriented to person, place, and time.  Psychiatric: She has a normal mood and affect.           Assessment &  Plan:

## 2018-05-11 NOTE — Patient Instructions (Signed)
Your symptoms are suggestive of seasonal allergies.  Start taking an allergy pill like Claritin, Allegra, or Zyrtec. Get the "off" brand, it works just as well.  Please notify us if you develop fevers, chills, body aches, shortness of breath.  It was a pleasure to see you today! Mayra Reel, NP-C

## 2018-05-12 NOTE — Telephone Encounter (Signed)
Pt left v/m requesting cb; I called pt back and got v/m and I left v/m that pt can communicate thru my chart or call office back [phone]. FYI to Allayne Gitelman NP and Johny Drilling CMA.

## 2018-05-12 NOTE — Telephone Encounter (Signed)
Pt left v/m that she does not feel OK,pt staying cold and if I understood message coughing will not go down.  I tried to call pt to see if she saw your latest my chart note but I was unable to reach pt by phone. FYI to [place].

## 2018-05-17 ENCOUNTER — Other Ambulatory Visit: Payer: Medicare Other

## 2018-05-19 ENCOUNTER — Encounter: Payer: Medicare Other | Admitting: Primary Care

## 2018-06-13 ENCOUNTER — Other Ambulatory Visit: Payer: Self-pay

## 2018-06-13 ENCOUNTER — Ambulatory Visit: Payer: Medicaid Other | Admitting: Neurology

## 2018-06-13 DIAGNOSIS — R569 Unspecified convulsions: Secondary | ICD-10-CM | POA: Diagnosis not present

## 2018-06-13 NOTE — Procedures (Signed)
    History:  Teresa Cervantes is a 48 year old patient with a history of seizure type events.  She initially was diagnosed with seizures in 2005.  The patient will ball up her fists and flail and start hitting herself, sometimes punching her own face.  Although her tongue sticks out, she has not bitten her tongue in the past.  The patient is being evaluated for seizures versus pseudoseizures.  This is a routine EEG.  No skull defects are noted.  Medications include Prozac and Dilantin.  EEG classification: Normal awake  Description of the recording: The background rhythms of this recording consists of a fairly well modulated medium amplitude alpha rhythm of 10 Hz that is reactive to eye opening and closure. As the record progresses, the patient appears to remain in the waking state throughout the recording. Photic stimulation was performed, resulting in a bilateral and symmetric photic driving response. Hyperventilation was also performed, resulting in a minimal buildup of the background rhythm activities without significant slowing seen.  During the recording, the patient has a typical clinical event associated with extensive muscle artifact, a brief time period between extensive motor activity shows normal EEG activity, following the event, normal EEG activity is noted.  At no time during the recording does there appear to be evidence of spike or spike wave discharges or evidence of focal slowing. EKG monitor shows no evidence of cardiac rhythm abnormalities with a heart rate of 78.  Impression: This is a normal EEG recording in the waking state.  The patient has a clinical event during this recording.  No evidence of ictal or interictal discharges are seen.  This study with the consistent with a nonepileptic seizure event.

## 2018-06-15 ENCOUNTER — Telehealth: Payer: Self-pay | Admitting: Neurology

## 2018-06-15 NOTE — Telephone Encounter (Signed)
Will need in-person visit per Dr. Jaynee Eagles.

## 2018-06-15 NOTE — Telephone Encounter (Signed)
I called the pt & LVM (ok per DPR) asking for call back to schedule pt for an appt to discuss EEG results and next steps with Dr. Jaynee Eagles. Advised we will need to ask her [place] screening questions and discuss check-in process as well. Mask required for visit. Left office number in message. Also advised pt mychart message would be sent.

## 2018-06-15 NOTE — Telephone Encounter (Signed)
Teresa Cervantes, would you call patient and schedule her for an appointment next week? I will need more time so block an hour or give me a spot right before lunch or at the end of the day. We can add a spot on Monday I donlt have an appointment that evening thanks

## 2018-06-20 ENCOUNTER — Ambulatory Visit: Payer: Self-pay | Admitting: Neurology

## 2018-06-22 ENCOUNTER — Encounter: Payer: Self-pay | Admitting: Neurology

## 2018-06-22 ENCOUNTER — Other Ambulatory Visit: Payer: Self-pay

## 2018-06-22 ENCOUNTER — Ambulatory Visit (INDEPENDENT_AMBULATORY_CARE_PROVIDER_SITE_OTHER): Payer: Medicaid Other | Admitting: Neurology

## 2018-06-22 DIAGNOSIS — F449 Dissociative and conversion disorder, unspecified: Secondary | ICD-10-CM

## 2018-06-22 DIAGNOSIS — Z91410 Personal history of adult physical and sexual abuse: Secondary | ICD-10-CM | POA: Diagnosis not present

## 2018-06-22 DIAGNOSIS — F445 Conversion disorder with seizures or convulsions: Secondary | ICD-10-CM

## 2018-06-22 DIAGNOSIS — F4310 Post-traumatic stress disorder, unspecified: Secondary | ICD-10-CM | POA: Diagnosis not present

## 2018-06-22 MED ORDER — PHENYTOIN SODIUM EXTENDED 100 MG PO CAPS: 200.0000 mg | ORAL_CAPSULE | Freq: Every day | ORAL | 0 refills | Status: AC

## 2018-06-22 NOTE — Patient Instructions (Signed)
Non-Epileptic Seizures, Adult  A seizure can cause:   Involuntary movements, like falling or shaking.   Changes in awareness or consciousness.   Convulsions. These are episodes of uncontrollable, jerking movement caused by sudden, intense tightening (contraction) of the muscles.  Epileptic seizures are caused by abnormal electrical activity in the brain. Non-epileptic seizures are different. They are not caused by abnormal electrical signals in your brain. These seizures may look like epileptic seizures, but they are not caused by epilepsy.  There are two types of non-epileptic seizures:   Physiologic non-epileptic seizure. This type results from an underlying problem that causes a disruption in your brain's electrical activity.   Psychogenic non-epileptic seizure. This type results from emotional stress. These seizures are sometimes called pseudoseizures.  What are the causes?  Causes of physiologic non-epileptic seizures can include:   Sudden drop in blood pressure.   Low blood sugar (glucose).   Low levels of salt (sodium) in your blood.   Low levels of calcium in your blood.   Migraine.   Heart rhythm problems.   Sleep disorders, such as narcolepsy.   Movement disorders, such as Tourette syndrome.   Infection.   Certain medicines.   Drug and alcohol abuse.   Fever.  Common causes of psychogenic non-epileptic seizures can include:   Stress.   Emotional trauma.   Sexual or physical abuse.   Major life events, such as divorce or death of a loved one.   Mental health disorders, including anxiety and depression.  What are the signs or symptoms?  Symptoms of a non-epileptic seizure can be similar to those of an epileptic seizure, which may include:   A change in attention or behavior (altered mental status).   Loss of consciousness or fainting.   Convulsions with rhythmic jerking movements.   Drooling.   Rapid eye movements.   Grunting.   Loss of bladder control and bowel  control.   Bitter taste in the mouth.   Tongue biting.  Some people experience unusual sensations (aura) before having a seizure. These can include:   "Butterflies" in the stomach.   Abnormal smells or tastes.   A feeling of having had a new experience before (dj vu).  After a non-epileptic seizure, you may have a headache or sore muscles or feel confused and sleepy. Non-epileptic seizures usually:   Do not cause physical injuries.   Start slowly.   Include crying or shrieking.   Last longer than 2 minutes.   Include pelvic thrusting.  How is this diagnosed?  Non-epileptic seizures may be diagnosed by:   Your medical history.   A physical exam.   Your symptoms.  ? Your health care provider may want to talk with your friends or relatives who have seen you have a seizure.  ? If possible, it is helpful if you write down your seizure activity, including what led up to the seizure, and share that information with your health care provider.  You may also need to have tests to look for causes of physiologic non-epileptic seizures. These may include:   An electroencephalogram (EEG). This test measures electrical activity in your brain. If you have had a non-epileptic seizure, the results of your EEG will likely be normal.   Video EEG. This test takes place in the hospital over the course of 2-7 days. The test uses a video camera and an EEG to monitor your symptoms and the electrical activity in your brain.   Blood tests.   Lumbar puncture.   This test involves pulling fluid from your spine to check for infection.   Electrocardiogram (ECG or EKG). This test checks for an abnormal heart rhythm.   CT scan.  If your health care provider thinks you have had a psychogenic non-epileptic seizure, you may need to see a mental health specialist for an evaluation.  How is this treated?  The treatment for your seizures will depend on what is causing them. When the underlying condition is treated, your seizures should  stop.  If your seizures are being caused by emotional trauma or stress, your health care provider may recommend that you see a mental health professional. Treatment may include:   Relaxation therapy or cognitive behavioral therapy.   Medicines to treat depression or anxiety.   Individual or family counseling.  In some cases, you may have psychogenic seizures in addition to epileptic seizures. If this is the case, you may be prescribed medicine to help with the epileptic seizures.  Follow these instructions at home:  Home care will depend on the type of non-epileptic seizures that you have. In general:   Follow all instructions from your health care provider. These may include ways to prevent seizures and what to do if you have a seizure.   Take over-the-counter and prescription medicines only as told by your health care provider.   Keep all follow-up visits as told by your health care provider. This is important.   Make sure family members, friends, and coworkers are trained on how to help you if you have a seizure. If you have a seizure, they should:  ? Lay you on the ground to prevent a fall.  ? Place a pillow or piece of clothing under your head.  ? Loosen any clothing around your neck.  ? Turn you onto your side. If vomiting occurs, this helps keep your airway clear.   Avoid any substances that may prevent your medicine from working properly. If you are prescribed medicine for seizures:  ? Do not use recreational drugs.  ? Limit or avoid alcoholic beverages.  Contact a health care provider if:   Your seizures change or become more frequent.   You continue to have seizures after treatment.  Get help right away if:   You injure yourself during a seizure.   You have one seizure after another.   You have trouble recovering from a seizure.   You have chest pain or trouble breathing.   You have a seizure that lasts longer than 5 minutes.  Summary   Non-epileptic seizures may look like epileptic  seizures, but they are not caused by epilepsy.   The treatment for your seizures will depend on what is causing them. When the underlying condition is treated, your seizures should stop.   Make sure family members, friends, and coworkers are trained on how to help you if you have a seizure. If you have a seizure, they should lay you on the ground to prevent a fall, protect your head and neck, and turn you onto your side.  This information is not intended to replace advice given to you by your health care provider. Make sure you discuss any questions you have with your health care provider.  Document Released: 02/06/2005 Document Revised: 10/04/2015 Document Reviewed: 10/04/2015  Elsevier Interactive Patient Education  2019 [employer]
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
## 2018-06-23 ENCOUNTER — Other Ambulatory Visit: Payer: Self-pay | Admitting: Primary Care

## 2018-06-23 DIAGNOSIS — E538 Deficiency of other specified B group vitamins: Secondary | ICD-10-CM

## 2018-06-23 DIAGNOSIS — E559 Vitamin D deficiency, unspecified: Secondary | ICD-10-CM

## 2018-06-23 DIAGNOSIS — Z0000 Encounter for general adult medical examination without abnormal findings: Secondary | ICD-10-CM

## 2018-06-28 ENCOUNTER — Other Ambulatory Visit: Payer: Medicaid Other

## 2018-07-01 ENCOUNTER — Encounter: Payer: Medicaid Other | Admitting: Primary Care

## 2018-07-01 ENCOUNTER — Encounter: Payer: Self-pay | Admitting: Primary Care

## 2020-01-29 IMAGING — DX DG CHEST 2V
2 series · 2 of 2 positions shown · non-contrast
Comparison: None.

CLINICAL DATA: Pain beneath the breast and chest tightness.

EXAM:
CHEST - 2 VIEW

[chest pa]
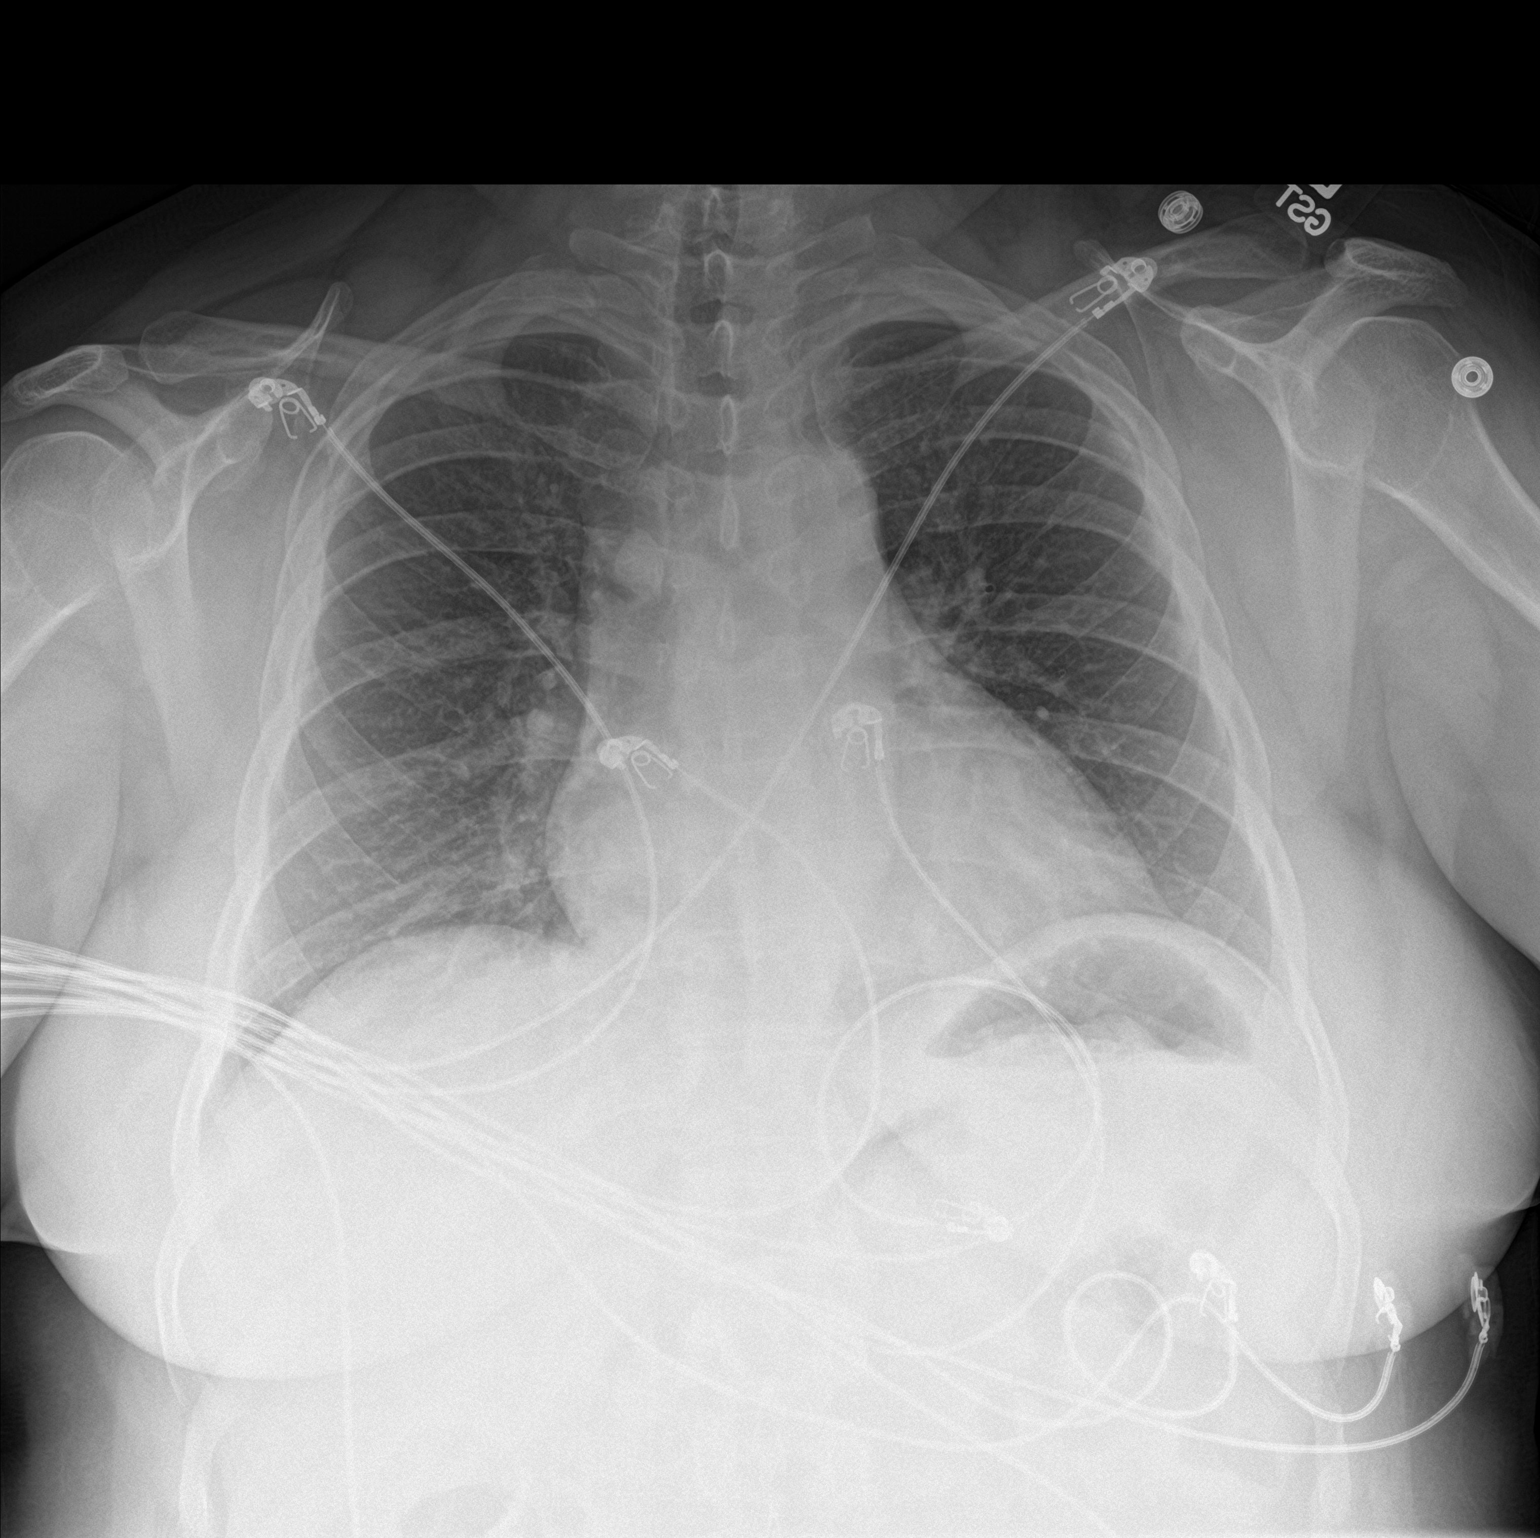

[chest lat]
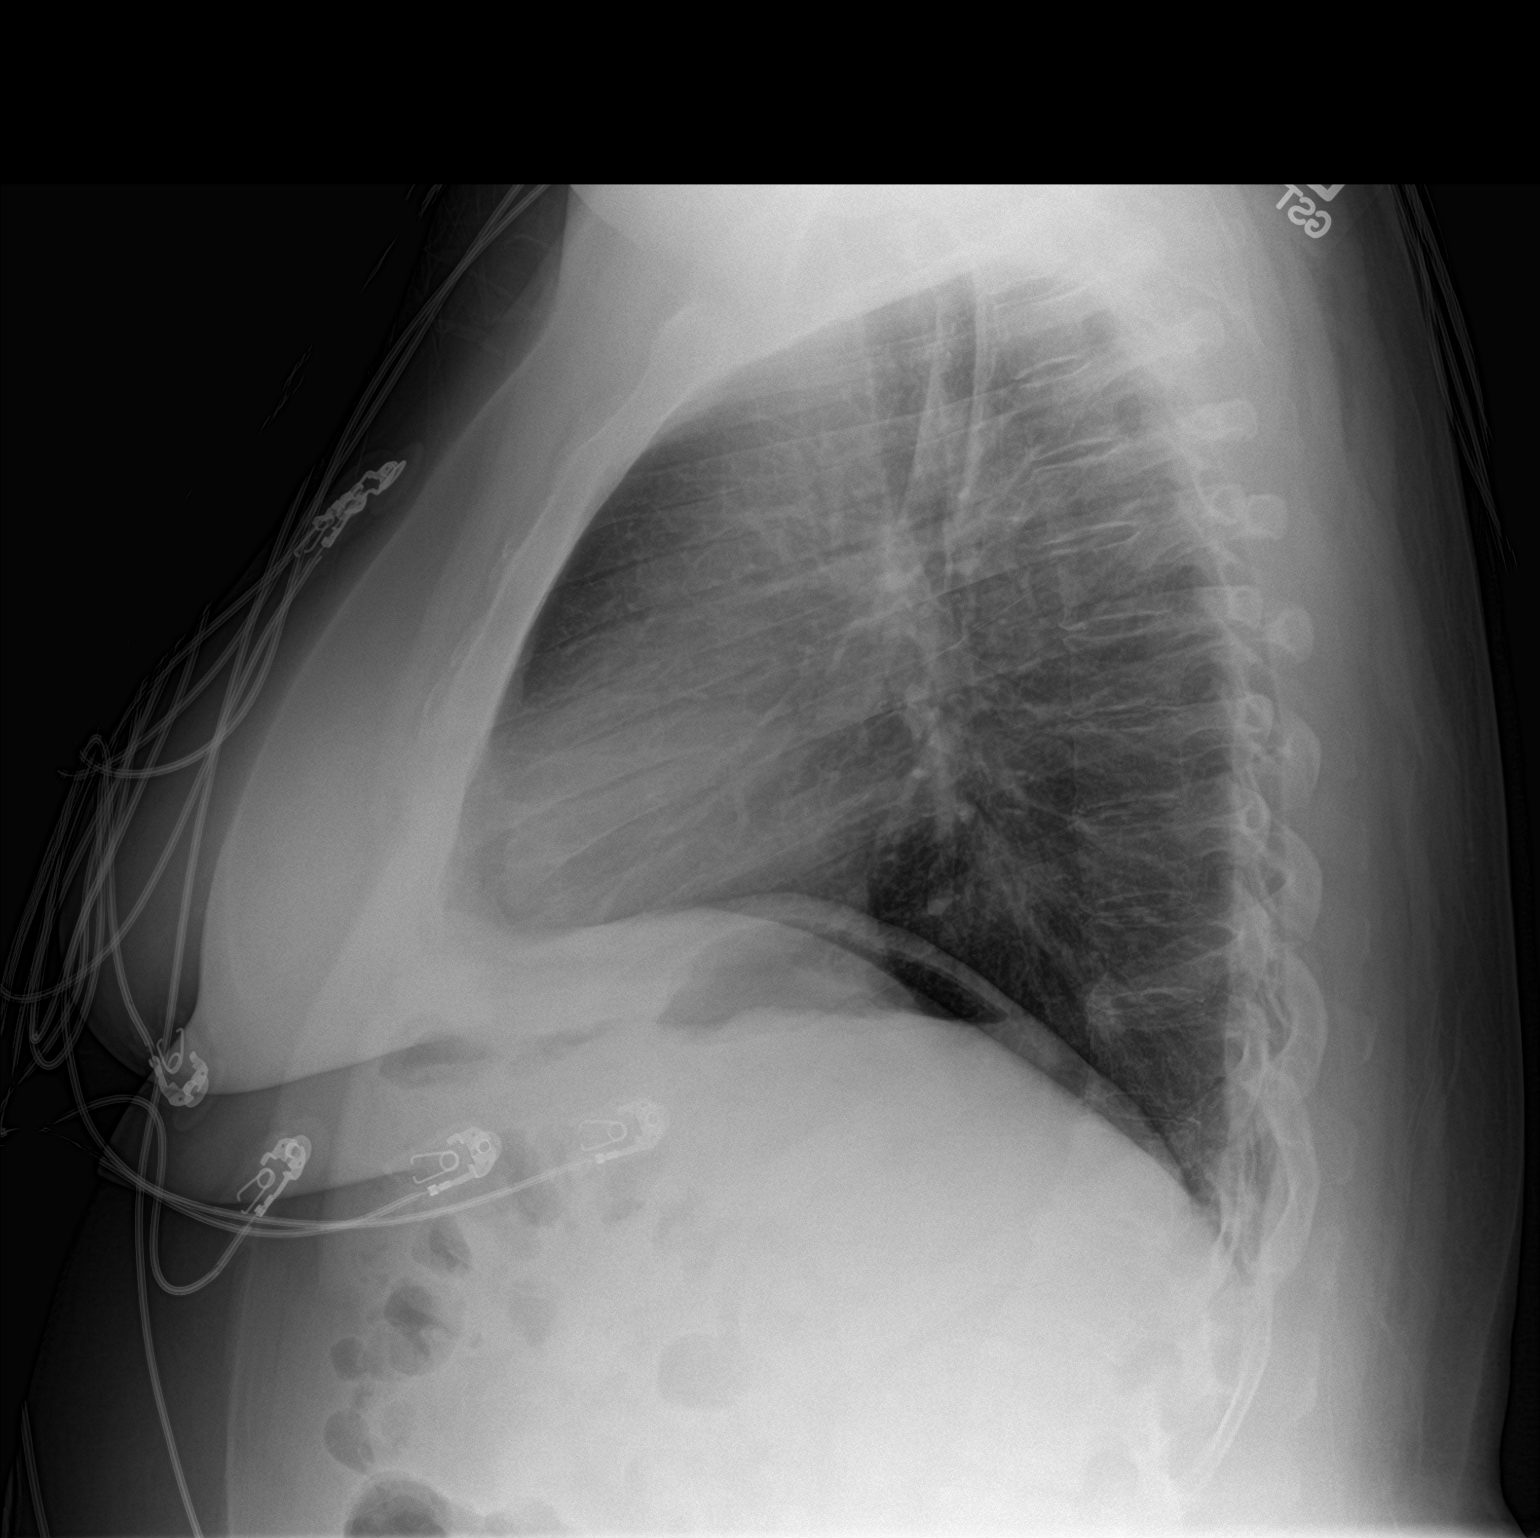

[2 of 2 positions shown; findings below may reference images not displayed]

FINDINGS: Slightly low lung volumes with crowding of interstitial lung
markings. No alveolar consolidation, effusion or pulmonary edema. No
pneumothorax. Borderline cardiomegaly with mild aortic
atherosclerosis. No acute osseous abnormality.
IMPRESSION: Low lung volumes without active pulmonary disease.

## 2020-03-05 DIAGNOSIS — R69 Illness, unspecified: Secondary | ICD-10-CM | POA: Diagnosis not present

## 2020-03-05 DIAGNOSIS — R110 Nausea: Secondary | ICD-10-CM | POA: Diagnosis not present

## 2020-03-05 DIAGNOSIS — R093 Abnormal sputum: Secondary | ICD-10-CM | POA: Diagnosis not present

## 2020-03-05 DIAGNOSIS — J984 Other disorders of lung: Secondary | ICD-10-CM | POA: Diagnosis not present

## 2020-03-05 DIAGNOSIS — I10 Essential (primary) hypertension: Secondary | ICD-10-CM | POA: Diagnosis not present

## 2020-03-05 DIAGNOSIS — R059 Cough, unspecified: Secondary | ICD-10-CM | POA: Diagnosis not present

## 2020-03-05 DIAGNOSIS — J029 Acute pharyngitis, unspecified: Secondary | ICD-10-CM | POA: Diagnosis not present

## 2020-03-05 DIAGNOSIS — R0781 Pleurodynia: Secondary | ICD-10-CM | POA: Diagnosis not present

## 2020-03-05 DIAGNOSIS — Z20822 Contact with and (suspected) exposure to covid-19: Secondary | ICD-10-CM | POA: Diagnosis not present

## 2020-03-05 DIAGNOSIS — R519 Headache, unspecified: Secondary | ICD-10-CM | POA: Diagnosis not present

## 2020-03-05 DIAGNOSIS — R0989 Other specified symptoms and signs involving the circulatory and respiratory systems: Secondary | ICD-10-CM | POA: Diagnosis not present

## 2020-03-05 DIAGNOSIS — B349 Viral infection, unspecified: Secondary | ICD-10-CM | POA: Diagnosis not present

## 2020-03-20 DIAGNOSIS — R69 Illness, unspecified: Secondary | ICD-10-CM | POA: Diagnosis not present

## 2020-03-20 DIAGNOSIS — I10 Essential (primary) hypertension: Secondary | ICD-10-CM | POA: Diagnosis not present

## 2020-05-02 DIAGNOSIS — F4310 Post-traumatic stress disorder, unspecified: Secondary | ICD-10-CM | POA: Diagnosis not present

## 2020-05-02 DIAGNOSIS — F603 Borderline personality disorder: Secondary | ICD-10-CM | POA: Diagnosis not present

## 2020-05-02 DIAGNOSIS — R69 Illness, unspecified: Secondary | ICD-10-CM | POA: Diagnosis not present

## 2021-11-04 ENCOUNTER — Encounter: Payer: Self-pay | Admitting: Internal Medicine

## 2021-12-12 ENCOUNTER — Ambulatory Visit: Payer: Medicare Other | Admitting: Internal Medicine
